# Patient Record
Sex: Male | Born: 1945 | Race: White | Hispanic: No | Marital: Married | Smoking: Never smoker
Health system: Southern US, Community
[De-identification: ages and names within clinical notes are randomized; demographics above are authoritative.]

## PROBLEM LIST (undated history)

## (undated) DIAGNOSIS — I10 Essential (primary) hypertension: Secondary | ICD-10-CM

## (undated) DIAGNOSIS — R7303 Prediabetes: Secondary | ICD-10-CM

## (undated) DIAGNOSIS — N189 Chronic kidney disease, unspecified: Secondary | ICD-10-CM

## (undated) DIAGNOSIS — I639 Cerebral infarction, unspecified: Secondary | ICD-10-CM

## (undated) HISTORY — DX: Prediabetes: R73.03

## (undated) HISTORY — PX: TONSILLECTOMY: SUR1361

## (undated) HISTORY — DX: Chronic kidney disease, unspecified: N18.9

## (undated) HISTORY — PX: CHOLECYSTECTOMY: SHX55

---

## 2003-03-07 ENCOUNTER — Ambulatory Visit (HOSPITAL_COMMUNITY): Admission: RE | Admit: 2003-03-07 | Discharge: 2003-03-07 | Payer: Self-pay | Admitting: Internal Medicine

## 2014-12-15 DIAGNOSIS — R7309 Other abnormal glucose: Secondary | ICD-10-CM | POA: Diagnosis not present

## 2015-01-08 DIAGNOSIS — J019 Acute sinusitis, unspecified: Secondary | ICD-10-CM | POA: Diagnosis not present

## 2015-01-08 DIAGNOSIS — B9689 Other specified bacterial agents as the cause of diseases classified elsewhere: Secondary | ICD-10-CM | POA: Diagnosis not present

## 2015-03-22 DIAGNOSIS — E119 Type 2 diabetes mellitus without complications: Secondary | ICD-10-CM | POA: Diagnosis not present

## 2015-05-14 DIAGNOSIS — D225 Melanocytic nevi of trunk: Secondary | ICD-10-CM | POA: Diagnosis not present

## 2015-05-14 DIAGNOSIS — L820 Inflamed seborrheic keratosis: Secondary | ICD-10-CM | POA: Diagnosis not present

## 2015-05-14 DIAGNOSIS — L821 Other seborrheic keratosis: Secondary | ICD-10-CM | POA: Diagnosis not present

## 2015-06-19 DIAGNOSIS — R42 Dizziness and giddiness: Secondary | ICD-10-CM | POA: Diagnosis not present

## 2015-06-19 DIAGNOSIS — H8111 Benign paroxysmal vertigo, right ear: Secondary | ICD-10-CM | POA: Diagnosis not present

## 2015-07-12 DIAGNOSIS — R1032 Left lower quadrant pain: Secondary | ICD-10-CM | POA: Diagnosis not present

## 2015-07-12 DIAGNOSIS — K5909 Other constipation: Secondary | ICD-10-CM | POA: Diagnosis not present

## 2015-07-12 DIAGNOSIS — R1031 Right lower quadrant pain: Secondary | ICD-10-CM | POA: Diagnosis not present

## 2015-07-12 DIAGNOSIS — K5900 Constipation, unspecified: Secondary | ICD-10-CM | POA: Diagnosis not present

## 2015-07-12 DIAGNOSIS — F419 Anxiety disorder, unspecified: Secondary | ICD-10-CM | POA: Diagnosis not present

## 2015-07-13 DIAGNOSIS — H8112 Benign paroxysmal vertigo, left ear: Secondary | ICD-10-CM | POA: Diagnosis not present

## 2015-07-18 DIAGNOSIS — H8112 Benign paroxysmal vertigo, left ear: Secondary | ICD-10-CM | POA: Diagnosis not present

## 2016-05-02 ENCOUNTER — Encounter (INDEPENDENT_AMBULATORY_CARE_PROVIDER_SITE_OTHER): Payer: Self-pay | Admitting: *Deleted

## 2016-06-19 ENCOUNTER — Other Ambulatory Visit: Payer: Self-pay | Admitting: Student

## 2016-06-19 ENCOUNTER — Ambulatory Visit: Payer: Self-pay

## 2016-06-19 DIAGNOSIS — M79662 Pain in left lower leg: Secondary | ICD-10-CM

## 2016-06-20 ENCOUNTER — Ambulatory Visit: Payer: Self-pay

## 2016-07-02 ENCOUNTER — Encounter (INDEPENDENT_AMBULATORY_CARE_PROVIDER_SITE_OTHER): Payer: Self-pay | Admitting: *Deleted

## 2016-07-02 ENCOUNTER — Other Ambulatory Visit (INDEPENDENT_AMBULATORY_CARE_PROVIDER_SITE_OTHER): Payer: Self-pay | Admitting: *Deleted

## 2016-07-02 DIAGNOSIS — Z1211 Encounter for screening for malignant neoplasm of colon: Secondary | ICD-10-CM | POA: Insufficient documentation

## 2016-10-09 ENCOUNTER — Encounter (INDEPENDENT_AMBULATORY_CARE_PROVIDER_SITE_OTHER): Payer: Self-pay | Admitting: *Deleted

## 2016-10-09 ENCOUNTER — Telehealth (INDEPENDENT_AMBULATORY_CARE_PROVIDER_SITE_OTHER): Payer: Self-pay | Admitting: *Deleted

## 2016-10-09 NOTE — Telephone Encounter (Signed)
Patient needs trilyte 

## 2016-10-14 MED ORDER — PEG 3350-KCL-NA BICARB-NACL 420 G PO SOLR: 4000.0000 mL | Freq: Once | ORAL | 0 refills | Status: AC

## 2016-10-21 ENCOUNTER — Telehealth (INDEPENDENT_AMBULATORY_CARE_PROVIDER_SITE_OTHER): Payer: Self-pay | Admitting: *Deleted

## 2016-10-21 NOTE — Telephone Encounter (Signed)
agree

## 2016-10-21 NOTE — Telephone Encounter (Signed)
Referring MD/PCP: hedrick   Procedure: tcs  Reason/Indication:  screening  Has patient had this procedure before?  Yes, over 10 yrs ago  If so, when, by whom and where?    Is there a family history of colon cancer?  no  Who?  What age when diagnosed?    Is patient diabetic?   no      Does patient have prosthetic heart valve or mechanical valve?  no  Do you have a pacemaker?  no  Has patient ever had endocarditis? no  Has patient had joint replacement within last 12 months?  no  Does patient tend to be constipated or take laxatives? no  Does patient have a history of alcohol/drug use?  no  Is patient on Coumadin, Plavix and/or Aspirin? no  Medications: flomax 0.4 mg daily, fluticasone nasal spray prn, claritin prn, albuterol  Allergies: pcn  Medication Adjustment:   Procedure date & time: 11/19/16 at 730

## 2016-11-19 ENCOUNTER — Encounter (HOSPITAL_COMMUNITY): Payer: Self-pay

## 2016-11-19 ENCOUNTER — Ambulatory Visit (HOSPITAL_COMMUNITY): Admit: 2016-11-19 | Payer: Medicare Other | Admitting: Internal Medicine

## 2016-11-19 SURGERY — COLONOSCOPY
Anesthesia: Moderate Sedation

## 2016-12-03 ENCOUNTER — Other Ambulatory Visit (INDEPENDENT_AMBULATORY_CARE_PROVIDER_SITE_OTHER): Payer: Self-pay | Admitting: *Deleted

## 2016-12-03 DIAGNOSIS — Z1211 Encounter for screening for malignant neoplasm of colon: Secondary | ICD-10-CM

## 2016-12-16 ENCOUNTER — Ambulatory Visit (INDEPENDENT_AMBULATORY_CARE_PROVIDER_SITE_OTHER): Payer: Medicare Other | Admitting: Internal Medicine

## 2016-12-16 ENCOUNTER — Encounter: Payer: Self-pay | Admitting: Internal Medicine

## 2016-12-16 VITALS — BP 130/82 | HR 88 | Wt 211.0 lb

## 2016-12-16 DIAGNOSIS — J42 Unspecified chronic bronchitis: Secondary | ICD-10-CM | POA: Diagnosis not present

## 2016-12-16 DIAGNOSIS — R05 Cough: Secondary | ICD-10-CM | POA: Diagnosis not present

## 2016-12-16 DIAGNOSIS — R059 Cough, unspecified: Secondary | ICD-10-CM

## 2016-12-16 MED ORDER — UMECLIDINIUM BROMIDE 62.5 MCG/INH IN AEPB
1.0000 | INHALATION_SPRAY | Freq: Every day | RESPIRATORY_TRACT | 5 refills | Status: DC
Start: 2016-12-16 — End: 2017-02-19

## 2016-12-16 MED ORDER — BENZONATATE 200 MG PO CAPS: 200.0000 mg | ORAL_CAPSULE | Freq: Three times a day (TID) | ORAL | 0 refills | Status: DC

## 2016-12-16 NOTE — Progress Notes (Signed)
[facility] Consultation      Assessment and Plan:  Chronic bronchitis. --this is likely contributing to his cough.  --Stop symbicort, stop nebulizer.  --Start Incruz sample once daily. If helps call us for a prescription.  --If not improved at next visit discussed that he may require bronchoscopy.  --Doubt asthma, did not respond to steroids and has minimal dyspnea.   Chronic cough.  --Start tessalon 200 mg three times daily.   Obesity.  --Weight loss may be beneficial.   Date: 12/16/2016  MRN# [id number] NAHUEL ENCE 08/23/1946  Referring Physician:   EPIGMENIO KOLTON is a 71 y.o. old male seen in consultation for chief complaint of:    Chief Complaint  Patient presents with  . Advice Only    cough, prod at times; denies other symptoms    HPI:  Pt is a 71 yo male with a history of asthma. He has seen Dr. Raul Del, for asthma over the past 6 months, he has been tried on prilosec, flonase, Tramadol, Delsym, prednisone. More recently, he was tried on Symbicort and albuterol. Review of outside PFT showed evidence of restrictive lung disease, but otherwise normal pulmonary functions.  His main problem started about a  7 or 8 months ago. Back then he has been having a dry hacking cough, it is productive of a little bit of mucus occasionally but usually dry. His muscles get sore from coughing. He has occasional paroxysms of coughing which make him dizzy. He notes an irritation in this throat and chest which starts in his chest.  He has been taking hydrocodone at night which helps but makes him very sleepy.  He has tried nyquil, robitussin, mucinex helped mildly but not much. He has tried lozenges which help.  He does not feel that symbicort is helping, also it gives him indigestion, he is using it 2 puffs bid, he hsa been on it.  He uses albuterol neb once or twice per day, but does not think that it helps.   They travel a lot, but they have not since he has  been travelling. They have no pets.  He worked as a Company secretary. He has never been diagnosed with lung problems in the past, he has never been a smoker.   He has been tried on [employer].   Reviewed outside records, Dr. Leane Platt recent office notes from 11/28/16;  Pt was treated for asthma, on prednisone, albuterol, symbicort, and potentially may require sleep study.   Diagnostics: SPIROMETRY: FVC was 3.44 liters, 105% of predicted FEV1 was 2.70, 105% of predicted FEV1 ratio was 79 FEF 25-75% liters per second was 97% of predicted  LUNG VOLUMES: TLC was 86% of predicted RV was 57% of predicted  DIFFUSION CAPACITY: DLCO was 108% of predicted DLCO/VA was 145% of predicted  FLOW VOLUME LOOP: Normal   Impression Overall normal study   PMHX:   History reviewed. No pertinent past medical history. Surgical Hx:  Past Surgical History:  Procedure Laterality Date  . CHOLECYSTECTOMY     Family Hx:  Family History  Problem Relation Age of Onset  . Heart disease Mother   . Hyperlipidemia Mother   . Heart disease Father   . Heart disease Maternal Grandmother   . Heart attack Maternal Grandmother    Social Hx:   Social History  Substance Use Topics  . Smoking status: Never Smoker  . Smokeless tobacco: Never Used  . Alcohol use No   Medication:   Reviewed.  Allergies:  Penicillins  Review of Systems: Gen:  Denies  fever, sweats, chills HEENT: Denies blurred vision, double vision. bleeds, sore throat Cvc:  No dizziness, chest pain. Resp:   Denies cough or sputum production, shortness of breath Gi: Denies swallowing difficulty, stomach pain. Gu:  Denies bladder incontinence, burning urine Ext:   No Joint pain, stiffness. Skin: No skin rash,  hives  Endoc:  No polyuria, polydipsia. Psych: No depression, insomnia. Other:  All other systems were reviewed with the patient and were negative other that what is mentioned in the HPI.   Physical Examination:   VS: BP  130/82 (BP Location: Left Arm, Cuff Size: Normal)   Pulse 88   Wt 211 lb (95.7 kg)   SpO2 98%   General Appearance: No distress  Neuro:without focal findings,  speech normal,  HEENT: PERRLA, EOM intact.   Pulmonary: normal breath sounds, No wheezing.  CardiovascularNormal S1,S2.  No m/r/g.   Abdomen: Benign, Soft, non-tender. Renal:  No costovertebral tenderness  GU:  No performed at this time. Endoc: No evident thyromegaly, no signs of acromegaly. Skin:   warm, no rashes, no ecchymosis  Extremities: normal, no cyanosis, clubbing.  Other findings:    LABORATORY PANEL:   CBC No results for input(s): WBC, HGB, HCT, PLT in the last 168 hours. ------------------------------------------------------------------------------------------------------------------  Chemistries  No results for input(s): NA, K, CL, CO2, GLUCOSE, BUN, CREATININE, CALCIUM, MG, AST, ALT, ALKPHOS, BILITOT in the last 168 hours.  Invalid input(s): GFRCGP ------------------------------------------------------------------------------------------------------------------  Cardiac Enzymes No results for input(s): TROPONINI in the last 168 hours. ------------------------------------------------------------  RADIOLOGY:  No results found.     Thank  you for the consultation and for allowing [facility] to assist in the care of your patient. Our recommendations are noted above.  Please contact us if we can be of further service.   Marda Stalker, MD.  Board Certified in Internal Medicine, Pulmonary Medicine, [place], and Sleep Medicine.  [facility] Office Number: [phone]  Patricia Pesa, M.D.  Vilinda Boehringer, M.D.  Merton Border, M.D  12/16/2016

## 2016-12-16 NOTE — Patient Instructions (Addendum)
--  Stop symbicort, stop nebulizer.   --Start Incruz sample once daily. If helps call us for a prescription.   --Start tessalon 200 mg three times daily.   --Take flonase and claritin.

## 2016-12-19 ENCOUNTER — Telehealth: Payer: Self-pay | Admitting: Internal Medicine

## 2016-12-19 NOTE — Telephone Encounter (Signed)
Pt states in order for him to use his coupon card for Incruse, he has to have a prescription. Please call.

## 2016-12-19 NOTE — Telephone Encounter (Signed)
Pt informed his RX has been ready at [facility] since 10/15/17. Pt states he will go pick it up. Nothing further needed.

## 2016-12-29 ENCOUNTER — Telehealth: Payer: Self-pay | Admitting: Internal Medicine

## 2016-12-29 MED ORDER — PREDNISONE 10 MG PO TABS: ORAL_TABLET | ORAL | 0 refills | Status: DC

## 2016-12-29 NOTE — Telephone Encounter (Signed)
Spoke with pt's spouse, who states pt has lingering non prod cough, wheezing, increased fatigued, weakness & no appetite. Pt's wife states they have an upcoming trip in two weeks, and she is afraid they will not be able to make it.  Denies fever, chills or sweats.   DR please advise. Thanks.

## 2016-12-29 NOTE — Telephone Encounter (Signed)
Pt wife called, states pt is no better, states he is coughing a lot, has a lot of congestion, is not eating, and sleeping most of the day. Please call.

## 2016-12-29 NOTE — Telephone Encounter (Signed)
Will try another course of prednisone Prednisone 10 mg tabs x 21.  Take 6 tablets on day 1 Take 5 tablets on day 2 Take 4 tablets on day 3 Take 3 tablets on day 4 Take 2 tablets on day 5 Take 1 tablet on day 6 then stop.

## 2016-12-29 NOTE — Telephone Encounter (Signed)
Pt aware of DR's recommendations & voiced his understanding. Rx sent to preferred pharmacy. Nothing further needed.

## 2017-01-05 ENCOUNTER — Telehealth: Payer: Self-pay | Admitting: Internal Medicine

## 2017-01-05 NOTE — Telephone Encounter (Signed)
Pt called, states he is not better, states he has been on pan inhaler, with no results. Please call.

## 2017-01-06 NOTE — Telephone Encounter (Signed)
Pt scheduled to come in and see DR on Thursday 01/08/17. Nothing further needed.

## 2017-01-06 NOTE — Telephone Encounter (Signed)
Pt is calling back and is very upset  Stating he called yesterday morning and has not even gotten a call back to check on him  He is upset and states he needs a call back ASAP or he will find another provider  He stated he had an appointment about a month ago He was in the inhaler he was using Incruse  He Called about two week ago about this We placed him on prednisone, he has done a few rounds of that with other providers   States it did not help  Side effects from that were: bowel control/sleeplessness/ irritability/ States the cough is still there but it is now worst than before   He states he really needs to be seen soon or given more advise on how to help him States if that can't happen then he will look elsewhere where they can do more than a once a month visit.   States he is been working on it since June and it doesn't seem to be getting any better.  If we don't have the time to even call that is not okay he will find another provider   Please advise.   He is also going out of town next Tuesday but needs to feel better before than  He is not feeling like we are truly taking care of him.

## 2017-01-07 ENCOUNTER — Telehealth: Payer: Self-pay | Admitting: *Deleted

## 2017-01-07 DIAGNOSIS — R05 Cough: Secondary | ICD-10-CM

## 2017-01-07 DIAGNOSIS — R059 Cough, unspecified: Secondary | ICD-10-CM

## 2017-01-07 NOTE — Telephone Encounter (Signed)
Pt informed to get a CXR prior to appt on 01/08/17.  CXR ordered. Nothing further needed.

## 2017-01-07 NOTE — Progress Notes (Signed)
[facility] Consultation      Assessment and Plan:  Chronic bronchitis. --this is likely contributing to his cough.  --Stop symbicort, stop nebulizer.  --Start Incruz sample once daily. If helps call us for a prescription.  --If not improved at next visit discussed that he may require bronchoscopy.  --Doubt asthma, did not respond to steroids and has minimal dyspnea.   Chronic cough.  --Continue tessalon 200 mg three times daily.  --Add mucinex DM three times daily scheduled.   Reflux/GERD.  --Continue nexium, reflux appears controlled on this.   Obesity.  --Weight loss may be beneficial.   Date: 01/07/2017  MRN# [id number] BILLYJOE GO [birth year]  Referring Physician:   ALEKSEI GOODLIN is a 71 y.o. old male seen in consultation for chief complaint of:    Chief Complaint  Patient presents with  . Acute Visit    cough no better even after Prednisone: prod cough w/yellow mucus: chest tightness    HPI:  Pt is a 71 yo male with a history of asthma. He has seen Dr. Raul Del, for asthma over the past 6 months, he has been tried on multiple different medications which have not been helpful including prilosec, prednisone, flonase, Tramadol, Delsym, prednisone, tessalon, Symbicort and albuterol. Nyquil, robitussin, mucinex were mildly helpful. Cough lozenges appeared to help. Review of outside PFT showed evidence of restrictive lung disease, but otherwise normal pulmonary functions. He has been taking hydrocodone at night which helps but makes him very sleepy.    Since last visit he was started on incruz which he took as instructed but did not help. He was then started on prednisone which did not help. He continues on tessalon, which have not helped. He notes that cough drops help a little bit.  He continues on nexium daily. He denies sinus drainage currently, he gets it occasionally. He does take claritin twice daily.    Reviewed outside records, Dr. Leane Platt  recent office notes from 11/28/16;  Pt was treated for asthma, on prednisone, albuterol, symbicort, and potentially may require sleep study.   **Images reviewed, chest x-ray. 01/08/17: Unremarkable. Lungs.  Diagnostics: SPIROMETRY: FVC was 3.44 liters, 105% of predicted FEV1 was 2.70, 105% of predicted FEV1 ratio was 79 FEF 25-75% liters per second was 97% of predicted  LUNG VOLUMES: TLC was 86% of predicted RV was 57% of predicted  DIFFUSION CAPACITY: DLCO was 108% of predicted DLCO/VA was 145% of predicted  FLOW VOLUME LOOP: Normal   Impression Overall normal study   PMHX:   No past medical history on file. Surgical Hx:  Past Surgical History:  Procedure Laterality Date  . CHOLECYSTECTOMY     Family Hx:  Family History  Problem Relation Age of Onset  . Heart disease Mother   . Hyperlipidemia Mother   . Heart disease Father   . Heart disease Maternal Grandmother   . Heart attack Maternal Grandmother    Social Hx:   Social History  Substance Use Topics  . Smoking status: Never Smoker  . Smokeless tobacco: Never Used  . Alcohol use No   Medication:   Reviewed.     Allergies:  Penicillins  Review of Systems: Gen:  Denies  fever, sweats, chills HEENT: Denies blurred vision, double vision. bleeds, sore throat Cvc:  No dizziness, chest pain. Resp:   Denies cough or sputum production, shortness of breath Gi: Denies swallowing difficulty, stomach pain. Gu:  Denies bladder incontinence, burning urine Ext:   No Joint pain, stiffness.  Skin: No skin rash,  hives  Endoc:  No polyuria, polydipsia. Psych: No depression, insomnia. Other:  All other systems were reviewed with the patient and were negative other that what is mentioned in the HPI.   Physical Examination:   VS: BP 134/88 (Cuff Size: Normal)   Pulse 96   Wt 210 lb (95.3 kg)   SpO2 96%   General Appearance: No distress  Neuro:without focal findings,  speech normal,  HEENT: PERRLA, EOM intact.     Pulmonary: normal breath sounds, No wheezing.  CardiovascularNormal S1,S2.  No m/r/g.   Abdomen: Benign, Soft, non-tender. Renal:  No costovertebral tenderness  GU:  No performed at this time. Endoc: No evident thyromegaly, no signs of acromegaly. Skin:   warm, no rashes, no ecchymosis  Extremities: normal, no cyanosis, clubbing.  Other findings:    LABORATORY PANEL:   CBC No results for input(s): WBC, HGB, HCT, PLT in the last 168 hours. ------------------------------------------------------------------------------------------------------------------  Chemistries  No results for input(s): NA, K, CL, CO2, GLUCOSE, BUN, CREATININE, CALCIUM, MG, AST, ALT, ALKPHOS, BILITOT in the last 168 hours.  Invalid input(s): GFRCGP ------------------------------------------------------------------------------------------------------------------  Cardiac Enzymes No results for input(s): TROPONINI in the last 168 hours. ------------------------------------------------------------  RADIOLOGY:  No results found.     Thank  you for the consultation and for allowing [facility] to assist in the care of your patient. Our recommendations are noted above.  Please contact us if we can be of further service.   Marda Stalker, MD.  Board Certified in Internal Medicine, Pulmonary Medicine, [place], and Sleep Medicine.  [facility] Office Number: [phone]  Patricia Pesa, M.D.  Vilinda Boehringer, M.D.  Merton Border, M.D  01/07/2017

## 2017-01-07 NOTE — Telephone Encounter (Signed)
-----   Message from [name], MD sent at 01/07/2017  4:15 AM EST ----- Regarding: CXR before OV.  Pt needs 2 v CXR before his OV with me ( if he had one recently at [facility], will still need another one as I can not view [facility] images).

## 2017-01-08 ENCOUNTER — Ambulatory Visit
Admission: RE | Admit: 2017-01-08 | Discharge: 2017-01-08 | Disposition: A | Discharge status: Home / Self Care | Payer: Medicare Other | Source: Ambulatory Visit | Attending: Internal Medicine | Admitting: Internal Medicine

## 2017-01-08 ENCOUNTER — Ambulatory Visit (INDEPENDENT_AMBULATORY_CARE_PROVIDER_SITE_OTHER): Payer: Medicare Other | Admitting: Internal Medicine

## 2017-01-08 ENCOUNTER — Encounter: Payer: Self-pay | Admitting: Internal Medicine

## 2017-01-08 VITALS — BP 134/88 | HR 96 | Wt 210.0 lb

## 2017-01-08 DIAGNOSIS — R918 Other nonspecific abnormal finding of lung field: Secondary | ICD-10-CM | POA: Diagnosis not present

## 2017-01-08 DIAGNOSIS — J42 Unspecified chronic bronchitis: Secondary | ICD-10-CM | POA: Diagnosis not present

## 2017-01-08 DIAGNOSIS — R059 Cough, unspecified: Secondary | ICD-10-CM

## 2017-01-08 DIAGNOSIS — R05 Cough: Secondary | ICD-10-CM

## 2017-01-08 MED ORDER — FLUTICASONE PROPIONATE 50 MCG/ACT NA SUSP: 2.0000 | Freq: Every day | NASAL | 2 refills | Status: AC

## 2017-01-08 NOTE — Patient Instructions (Addendum)
--  Will need to perform diagnostic bronchoscopy-- follow up about 1 month after bronchoscopy. DO NOT TAKE ANY ANTIBIOTICS OR ANY PREDNISONE BEFORE PROCEDURE.   --Take mucinex DM three times daily. Continue cough drops.   --Will check CBC with differential before bronchoscopy.   --Will check RAST testing before bronchoscopy-- stop claritin for one week before RAST test.   --Use flonase 2 sprays in each nostril at bedtime.

## 2017-01-20 ENCOUNTER — Telehealth: Payer: Self-pay | Admitting: Internal Medicine

## 2017-01-20 NOTE — Telephone Encounter (Signed)
Pt states he will be back in town on 3/22. States he understands he is to have labwork. Please call.

## 2017-01-20 NOTE — Telephone Encounter (Signed)
Informed pt where to go and get his lab work. Nothing further needed.

## 2017-01-22 ENCOUNTER — Other Ambulatory Visit
Admission: RE | Admit: 2017-01-22 | Discharge: 2017-01-22 | Disposition: A | Discharge status: Home / Self Care | Payer: Medicare Other | Source: Ambulatory Visit | Attending: Internal Medicine | Admitting: Internal Medicine

## 2017-01-22 DIAGNOSIS — R05 Cough: Secondary | ICD-10-CM | POA: Diagnosis present

## 2017-01-22 DIAGNOSIS — J42 Unspecified chronic bronchitis: Secondary | ICD-10-CM | POA: Insufficient documentation

## 2017-01-22 LAB — CBC WITH DIFFERENTIAL/PLATELET
BASOS ABS: 0 10*3/uL (ref 0–0.1)
Basophils Relative: 0 %
EOS ABS: 0.2 10*3/uL (ref 0–0.7)
EOS PCT: 3 %
HCT: 46.9 % (ref 40.0–52.0)
Hemoglobin: 15.8 g/dL (ref 13.0–18.0)
LYMPHS ABS: 1.6 10*3/uL (ref 1.0–3.6)
Lymphocytes Relative: 23 %
MCH: 30.5 pg (ref 26.0–34.0)
MCHC: 33.6 g/dL (ref 32.0–36.0)
MCV: 90.7 fL (ref 80.0–100.0)
MONO ABS: 0.6 10*3/uL (ref 0.2–1.0)
MONOS PCT: 8 %
NEUTROS PCT: 66 %
Neutro Abs: 4.6 10*3/uL (ref 1.4–6.5)
PLATELETS: 234 10*3/uL (ref 150–440)
RBC: 5.17 MIL/uL (ref 4.40–5.90)
RDW: 13.7 % (ref 11.5–14.5)
WBC: 7 10*3/uL (ref 3.8–10.6)

## 2017-01-27 ENCOUNTER — Ambulatory Visit: Payer: Medicare Other | Admitting: Internal Medicine

## 2017-01-27 LAB — LATEX, IGE

## 2017-01-27 NOTE — Progress Notes (Deleted)
[facility] Consultation      Assessment and Plan:  Chronic bronchitis. --this is likely contributing to his cough.  --Stop symbicort, stop nebulizer.  --If not improved at next visit discussed that he may require bronchoscopy.  --Doubt asthma, did not respond to steroids and has minimal dyspnea.   Chronic cough.  --Continue tessalon 200 mg three times daily.  --Add mucinex DM three times daily scheduled.   Reflux/GERD.  --Continue nexium, reflux appears controlled on this.   Obesity.  --Weight loss may be beneficial.   Date: 01/27/2017  MRN# [id number] MAKS CAVALLERO August 04, 1946  Referring Physician:   ANTHONIE LOTITO is a 71 y.o. old male seen in consultation for chief complaint of:    No chief complaint on file.   HPI:  Pt is a 71 yo male with a history of chronic bronchitis, chronic and recurrent cough. He has been tried on multiple different medications which have not been helpful including prilosec, prednisone, flonase, Tramadol, Delsym, prednisone, tessalon, Symbicort and albuterol, Incruz.  Nyquil, robitussin, mucinex were mildly helpful. Cough lozenges appeared to help. At last visit, it was decided that we would do a diagnostic bronchoscopy, he was asked to check a CBC with differential, and rest test before bronchoscopy. He was asked to stop Claritin one week before rest testing, and use Flonase 2 sprays in each nostril.  Review of outside PFT showed evidence of restrictive lung disease, but otherwise normal pulmonary functions. He has been taking hydrocodone at night which helps but makes him very sleepy.    He continues on nexium daily. He denies sinus drainage currently, he gets it occasionally. He does take claritin twice daily.    Reviewed outside records, Dr. Leane Platt recent office notes from 11/28/16;  Pt was treated for asthma, on prednisone, albuterol, symbicort, and potentially may require sleep study.   **Images reviewed, chest  x-ray. 01/08/17: Unremarkable. Lungs.  Diagnostics: SPIROMETRY: FVC was 3.44 liters, 105% of predicted FEV1 was 2.70, 105% of predicted FEV1 ratio was 79 FEF 25-75% liters per second was 97% of predicted  LUNG VOLUMES: TLC was 86% of predicted RV was 57% of predicted  DIFFUSION CAPACITY: DLCO was 108% of predicted DLCO/VA was 145% of predicted  FLOW VOLUME LOOP: Normal   Impression Overall normal study   PMHX:   No past medical history on file. Surgical Hx:  Past Surgical History:  Procedure Laterality Date  . CHOLECYSTECTOMY     Family Hx:  Family History  Problem Relation Age of Onset  . Heart disease Mother   . Hyperlipidemia Mother   . Heart disease Father   . Heart disease Maternal Grandmother   . Heart attack Maternal Grandmother    Social Hx:   Social History  Substance Use Topics  . Smoking status: Never Smoker  . Smokeless tobacco: Never Used  . Alcohol use No   Medication:   Reviewed.     Allergies:  Penicillins  Review of Systems: Gen:  Denies  fever, sweats, chills HEENT: Denies blurred vision, double vision. bleeds, sore throat Cvc:  No dizziness, chest pain. Resp:   Denies cough or sputum production, shortness of breath Gi: Denies swallowing difficulty, stomach pain. Gu:  Denies bladder incontinence, burning urine Ext:   No Joint pain, stiffness. Skin: No skin rash,  hives  Endoc:  No polyuria, polydipsia. Psych: No depression, insomnia. Other:  All other systems were reviewed with the patient and were negative other that what is mentioned in the HPI.  Physical Examination:   VS: There were no vitals taken for this visit.  General Appearance: No distress  Neuro:without focal findings,  speech normal,  HEENT: PERRLA, EOM intact.   Pulmonary: normal breath sounds, No wheezing.  CardiovascularNormal S1,S2.  No m/r/g.   Abdomen: Benign, Soft, non-tender. Renal:  No costovertebral tenderness  GU:  No performed at this time. Endoc:  No evident thyromegaly, no signs of acromegaly. Skin:   warm, no rashes, no ecchymosis  Extremities: normal, no cyanosis, clubbing.  Other findings:    LABORATORY PANEL:   CBC  Recent Labs Lab 01/22/17 1212  WBC 7.0  HGB 15.8  HCT 46.9  PLT 234   ------------------------------------------------------------------------------------------------------------------  Chemistries  No results for input(s): NA, K, CL, CO2, GLUCOSE, BUN, CREATININE, CALCIUM, MG, AST, ALT, ALKPHOS, BILITOT in the last 168 hours.  Invalid input(s): GFRCGP ------------------------------------------------------------------------------------------------------------------  Cardiac Enzymes No results for input(s): TROPONINI in the last 168 hours. ------------------------------------------------------------  RADIOLOGY:  No results found.     Thank  you for the consultation and for allowing [facility] to assist in the care of your patient. Our recommendations are noted above.  Please contact us if we can be of further service.   Marda Stalker, MD.  Board Certified in Internal Medicine, Pulmonary Medicine, [place], and Sleep Medicine.  [facility] Office Number: [phone]  Patricia Pesa, M.D.  Vilinda Boehringer, M.D.  Merton Border, M.D  01/27/2017

## 2017-02-04 ENCOUNTER — Telehealth (INDEPENDENT_AMBULATORY_CARE_PROVIDER_SITE_OTHER): Payer: Self-pay | Admitting: *Deleted

## 2017-02-04 ENCOUNTER — Encounter (INDEPENDENT_AMBULATORY_CARE_PROVIDER_SITE_OTHER): Payer: Self-pay | Admitting: *Deleted

## 2017-02-04 NOTE — Telephone Encounter (Signed)
Referring MD/PCP: hedrick   Procedure: tcs  Reason/Indication:  screening  Has patient had this procedure before?  Yes, over 10 yrs ago             If so, when, by whom and where?    Is there a family history of colon cancer?  no             Who?  What age when diagnosed?    Is patient diabetic?   no                                                  Does patient have prosthetic heart valve or mechanical valve?  no  Do you have a pacemaker?  no  Has patient ever had endocarditis? no  Has patient had joint replacement within last 12 months?  no  Does patient tend to be constipated or take laxatives? no  Does patient have a history of alcohol/drug use?  no  Is patient on Coumadin, Plavix and/or Aspirin? no  Medications: flomax 0.4 mg daily, fluticasone nasal spray prn, claritin prn, albuterol  Allergies: pcn  Medication Adjustment:   Procedure date & time: 03/04/17 at 830

## 2017-02-04 NOTE — Telephone Encounter (Signed)
agree

## 2017-02-05 ENCOUNTER — Telehealth: Payer: Self-pay | Admitting: Internal Medicine

## 2017-02-05 ENCOUNTER — Encounter
Admission: RE | Disposition: A | Discharge status: Home / Self Care | Payer: Self-pay | Source: Ambulatory Visit | Attending: Internal Medicine

## 2017-02-05 ENCOUNTER — Ambulatory Visit
Admission: RE | Admit: 2017-02-05 | Discharge: 2017-02-05 | Disposition: A | Discharge status: Home / Self Care | Payer: Medicare Other | Source: Ambulatory Visit | Attending: Internal Medicine | Admitting: Internal Medicine

## 2017-02-05 DIAGNOSIS — J42 Unspecified chronic bronchitis: Secondary | ICD-10-CM | POA: Insufficient documentation

## 2017-02-05 DIAGNOSIS — K219 Gastro-esophageal reflux disease without esophagitis: Secondary | ICD-10-CM | POA: Diagnosis not present

## 2017-02-05 DIAGNOSIS — J984 Other disorders of lung: Secondary | ICD-10-CM | POA: Diagnosis not present

## 2017-02-05 DIAGNOSIS — R05 Cough: Secondary | ICD-10-CM | POA: Insufficient documentation

## 2017-02-05 DIAGNOSIS — E669 Obesity, unspecified: Secondary | ICD-10-CM | POA: Insufficient documentation

## 2017-02-05 HISTORY — PX: FLEXIBLE BRONCHOSCOPY: SHX5094

## 2017-02-05 SURGERY — BRONCHOSCOPY, FLEXIBLE
Anesthesia: Moderate Sedation

## 2017-02-05 SURGERY — BRONCHOSCOPY, FLEXIBLE
Anesthesia: Moderate Sedation | Laterality: Bilateral

## 2017-02-05 MED ORDER — FENTANYL CITRATE (PF) 100 MCG/2ML IJ SOLN
INTRAMUSCULAR | Status: AC | PRN
  Administered 2017-02-05: 50 ug via INTRAVENOUS

## 2017-02-05 MED ORDER — FENTANYL CITRATE (PF) 100 MCG/2ML IJ SOLN
INTRAMUSCULAR | Status: AC
  Filled 2017-02-05: qty 4

## 2017-02-05 MED ORDER — LIDOCAINE HCL 2 % EX GEL: 1.0000 "application " | Freq: Once | CUTANEOUS | Status: DC

## 2017-02-05 MED ORDER — MIDAZOLAM HCL 5 MG/5ML IJ SOLN
INTRAMUSCULAR | Status: AC
  Filled 2017-02-05: qty 10

## 2017-02-05 MED ORDER — BUTAMBEN-TETRACAINE-BENZOCAINE 2-2-14 % EX AERO
1.0000 | INHALATION_SPRAY | Freq: Once | CUTANEOUS | Status: DC
  Filled 2017-02-05: qty 20

## 2017-02-05 MED ORDER — MIDAZOLAM HCL 2 MG/2ML IJ SOLN
INTRAMUSCULAR | Status: AC | PRN
  Administered 2017-02-05 (×2): 2 mg via INTRAVENOUS

## 2017-02-05 MED ORDER — PHENYLEPHRINE HCL 0.25 % NA SOLN
1.0000 | Freq: Four times a day (QID) | NASAL | Status: DC | PRN
  Filled 2017-02-05: qty 15

## 2017-02-05 MED ORDER — BUTAMBEN-TETRACAINE-BENZOCAINE 2-2-14 % EX AERO
INHALATION_SPRAY | CUTANEOUS | Status: AC
  Filled 2017-02-05: qty 20

## 2017-02-05 NOTE — Sedation Documentation (Signed)
Patient tolerated procedure well. Received total of 16mcg fentanyl and 4mg  versed. Vital signs stable. Will transfer to recovery area shortly.

## 2017-02-05 NOTE — H&P (View-Only) (Signed)
[facility] Consultation      Assessment and Plan:  Chronic bronchitis. --this is likely contributing to his cough.  --Stop symbicort, stop nebulizer.  --Start Incruz sample once daily. If helps call us for a prescription.  --If not improved at next visit discussed that he may require bronchoscopy.  --Doubt asthma, did not respond to steroids and has minimal dyspnea.   Chronic cough.  --Continue tessalon 200 mg three times daily.  --Add mucinex DM three times daily scheduled.   Reflux/GERD.  --Continue nexium, reflux appears controlled on this.   Obesity.  --Weight loss may be beneficial.   Date: 01/07/2017  MRN# [id number] Tyrone Fox July 15, 1946  Referring Physician:   ELAINE MIDDLETON is a 71 y.o. old male seen in consultation for chief complaint of:    Chief Complaint  Patient presents with  . Acute Visit    cough no better even after Prednisone: prod cough w/yellow mucus: chest tightness    HPI:  Pt is a 71 yo male with a history of asthma. He has seen Dr. Raul Del, for asthma over the past 6 months, he has been tried on multiple different medications which have not been helpful including prilosec, prednisone, flonase, Tramadol, Delsym, prednisone, tessalon, Symbicort and albuterol. Nyquil, robitussin, mucinex were mildly helpful. Cough lozenges appeared to help. Review of outside PFT showed evidence of restrictive lung disease, but otherwise normal pulmonary functions. He has been taking hydrocodone at night which helps but makes him very sleepy.    Since last visit he was started on incruz which he took as instructed but did not help. He was then started on prednisone which did not help. He continues on tessalon, which have not helped. He notes that cough drops help a little bit.  He continues on nexium daily. He denies sinus drainage currently, he gets it occasionally. He does take claritin twice daily.    Reviewed outside records, Dr. Leane Platt  recent office notes from 11/28/16;  Pt was treated for asthma, on prednisone, albuterol, symbicort, and potentially may require sleep study.   **Images reviewed, chest x-ray. 01/08/17: Unremarkable. Lungs.  Diagnostics: SPIROMETRY: FVC was 3.44 liters, 105% of predicted FEV1 was 2.70, 105% of predicted FEV1 ratio was 79 FEF 25-75% liters per second was 97% of predicted  LUNG VOLUMES: TLC was 86% of predicted RV was 57% of predicted  DIFFUSION CAPACITY: DLCO was 108% of predicted DLCO/VA was 145% of predicted  FLOW VOLUME LOOP: Normal   Impression Overall normal study   PMHX:   No past medical history on file. Surgical Hx:  Past Surgical History:  Procedure Laterality Date  . CHOLECYSTECTOMY     Family Hx:  Family History  Problem Relation Age of Onset  . Heart disease Mother   . Hyperlipidemia Mother   . Heart disease Father   . Heart disease Maternal Grandmother   . Heart attack Maternal Grandmother    Social Hx:   Social History  Substance Use Topics  . Smoking status: Never Smoker  . Smokeless tobacco: Never Used  . Alcohol use No   Medication:   Reviewed.     Allergies:  Penicillins  Review of Systems: Gen:  Denies  fever, sweats, chills HEENT: Denies blurred vision, double vision. bleeds, sore throat Cvc:  No dizziness, chest pain. Resp:   Denies cough or sputum production, shortness of breath Gi: Denies swallowing difficulty, stomach pain. Gu:  Denies bladder incontinence, burning urine Ext:   No Joint pain, stiffness.  Skin: No skin rash,  hives  Endoc:  No polyuria, polydipsia. Psych: No depression, insomnia. Other:  All other systems were reviewed with the patient and were negative other that what is mentioned in the HPI.   Physical Examination:   VS: BP 134/88 (Cuff Size: Normal)   Pulse 96   Wt 210 lb (95.3 kg)   SpO2 96%   General Appearance: No distress  Neuro:without focal findings,  speech normal,  HEENT: PERRLA, EOM intact.     Pulmonary: normal breath sounds, No wheezing.  CardiovascularNormal S1,S2.  No m/r/g.   Abdomen: Benign, Soft, non-tender. Renal:  No costovertebral tenderness  GU:  No performed at this time. Endoc: No evident thyromegaly, no signs of acromegaly. Skin:   warm, no rashes, no ecchymosis  Extremities: normal, no cyanosis, clubbing.  Other findings:    LABORATORY PANEL:   CBC No results for input(s): WBC, HGB, HCT, PLT in the last 168 hours. ------------------------------------------------------------------------------------------------------------------  Chemistries  No results for input(s): NA, K, CL, CO2, GLUCOSE, BUN, CREATININE, CALCIUM, MG, AST, ALT, ALKPHOS, BILITOT in the last 168 hours.  Invalid input(s): GFRCGP ------------------------------------------------------------------------------------------------------------------  Cardiac Enzymes No results for input(s): TROPONINI in the last 168 hours. ------------------------------------------------------------  RADIOLOGY:  No results found.     Thank  you for the consultation and for allowing [facility] to assist in the care of your patient. Our recommendations are noted above.  Please contact us if we can be of further service.   Marda Stalker, MD.  Board Certified in Internal Medicine, Pulmonary Medicine, [place], and Sleep Medicine.  [facility] Office Number: [phone]  Patricia Pesa, M.D.  Vilinda Boehringer, M.D.  Merton Border, M.D  01/07/2017

## 2017-02-05 NOTE — Op Note (Signed)
  [facility]            Bronchoscopy Note   FINDINGS/SUMMARY:   --Findings of severe chronic bronchitis throughout both airways.  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  --No significant anatomical abnormalities to explain cough. --BAL taken at lingula and RML.   Indication: Chronic bronchitis, chronic cough The patient (or their representative) was informed of the risks (including but not limited to bleeding, infection, respiratory failure, lung injury, tooth/oral injury) and benefits of the procedure and gave consent, see chart.   Pre-op diagnosis: Chronic bronchitis  Post-op diagnosis: Same, bronchopulmonary dysplasia.  Estimated blood loss: none.   Medications for procedure:  Versed 4 mg, fentanyl 50 mcg.  Procedure description: After obtaining informed consent a timeout was called to confirm patient and procedure. Lidocaine jelly was applied to the left nares, the scope was then passed via the nares to the posterior pharynx, normal movement of the vocal cords was noted, there was significant collapse of the airway in this area possibly consistent with OSA.  AN anatomical tour was undertaken, there was mild bronchopulmonary dysplasia with mild collapse of the posterior trachea and the proximal airways.  There was findings of significant airway erythema throughout both airways consistent with severe chronic bronchitis. There was mild mucosal secretions. The scope was wedged in the lingula, BAL was taken, sent for cytology. The bronchoscope was wedged into the RML and another BAL was taken, sent for culture.  The scope was then removed and the patient was taken to recovery.   Marda Stalker, M.D.  02/05/2017      Condition post procedure: Stable   Complications: none.     Marda Stalker, MD.  Board Certified in Internal Medicine, Pulmonary Medicine, [place], and  Sleep Medicine.  [facility] Office Number: [phone]  Patricia Pesa, M.D.  Cheral Marker, M.D  02/05/2017

## 2017-02-05 NOTE — Interval H&P Note (Signed)
History and Physical Interval Note:  02/05/2017 2:22 PM  Tyrone Fox  has presented today for surgery, with the diagnosis of Regular bronch    Chronic Cough    OK [name] [facility] needed  The various methods of treatment have been discussed with the patient and family. After consideration of risks, benefits and other options for treatment, the patient has consented to  Procedure(s): FLEXIBLE BRONCHOSCOPY (N/A) as a surgical intervention .  The patient's history has been reviewed, patient examined, no change in status, stable for surgery.  I have reviewed the patient's chart and labs.  Questions were answered to the patient's satisfaction.     [name]

## 2017-02-05 NOTE — Telephone Encounter (Signed)
Needs 1-2 wk fu s/p bronch [facility].  Next available in may.

## 2017-02-05 NOTE — Progress Notes (Signed)
Patient remains clinically stable post bronchoscopy, occasional cough, no bleeding visible. Friend remains present at bedside. Denies complaints, discharge instructions given with questions answered. Taking po liquids at this time, tolerating without difficultyl

## 2017-02-05 NOTE — Telephone Encounter (Signed)
Please schedule 02/19/17 @ 12pm with Tyrone Fox. Pt to arrive at 11:45am

## 2017-02-06 ENCOUNTER — Encounter: Payer: Self-pay | Admitting: Internal Medicine

## 2017-02-06 LAB — ACID FAST SMEAR (AFB, MYCOBACTERIA): Acid Fast Smear: NEGATIVE

## 2017-02-06 LAB — ACID FAST SMEAR (AFB)

## 2017-02-06 NOTE — Telephone Encounter (Signed)
lmov to confirm appt is ok.

## 2017-02-06 NOTE — Telephone Encounter (Signed)
Pt called back to confirm apt

## 2017-02-09 LAB — CYTOLOGY - NON PAP

## 2017-02-19 ENCOUNTER — Encounter: Payer: Self-pay | Admitting: Internal Medicine

## 2017-02-19 ENCOUNTER — Ambulatory Visit (INDEPENDENT_AMBULATORY_CARE_PROVIDER_SITE_OTHER): Payer: Medicare Other | Admitting: Internal Medicine

## 2017-02-19 VITALS — BP 132/76 | HR 83 | Ht 67.0 in | Wt 213.6 lb

## 2017-02-19 DIAGNOSIS — J42 Unspecified chronic bronchitis: Secondary | ICD-10-CM | POA: Diagnosis not present

## 2017-02-19 NOTE — Progress Notes (Signed)
[facility] Consultation      Assessment and Plan:  Chronic cough with Chronic bronchitis. --s/p bronchoscopy, negative --continue tessalon, claritin if needed. Can stop nasal steroid.   --Doubt asthma, did not respond to steroids and has minimal dyspnea.   Chronic cough.  --Continue tessalon 200 mg three times daily.  --continue mucinex.  --May use claritin if needed for his allergies.   Reflux/GERD.  --Continue nexium, reflux appears controlled on this.   Obesity.  --Weight loss may be beneficial.   Bronchopulmonary dysplasia.  --Seen on bronchoscopy, this may contribute to difficulty in airway clearance, but likely of secondary importance. The patient would not be a candidate for airway stenting.   Date: 02/19/2017  MRN# [id number] Tyrone Fox November 24, 1945  Referring Physician:   MAHDI FRYE is a 71 y.o. old male seen in consultation for chief complaint of:    Chief Complaint  Patient presents with  . Follow-up    review bronch results. pt states coughing has slightly improved since last OV. pt reports of prod cough with yellow mucus.    HPI:   Since his last visit he underwent a bronchoscopy on 02/05/17 which showed only changes of severe chronic bronchitis,  Mild bronchopulmonary dysplasia, otherwise unremarkable. He feels that he was getting better before the bronchoscopy and he continues to do so. He has also started going to the gym and doing more exercise and going to the sauna.   He was started on incruz which he took as instructed but did not help. He was then started on prednisone which did not help. He continues on tessalon, which have not helped. He notes that cough drops help a little bit.  He continues on nexium daily. He denies sinus drainage currently, he gets it occasionally. He does take mucinex twice daily, claritin once daily, he stopped flonase.    Reviewed outside records, Dr. Leane Platt recent office notes from 11/28/16;  Pt  was treated for asthma, on prednisone, albuterol, symbicort, and potentially may require sleep study. He notes that he snores when he is very tired.   **Images:chest x-ray. 01/08/17: Unremarkable. Lungs.  Diagnostics: SPIROMETRY: FVC was 3.44 liters, 105% of predicted FEV1 was 2.70, 105% of predicted FEV1 ratio was 79 FEF 25-75% liters per second was 97% of predicted  LUNG VOLUMES: TLC was 86% of predicted RV was 57% of predicted  DIFFUSION CAPACITY: DLCO was 108% of predicted DLCO/VA was 145% of predicted  FLOW VOLUME LOOP: Normal   Impression Overall normal study   PMHX:   No past medical history on file. Surgical Hx:  Past Surgical History:  Procedure Laterality Date  . CHOLECYSTECTOMY    . FLEXIBLE BRONCHOSCOPY N/A 02/05/2017   Procedure: FLEXIBLE BRONCHOSCOPY;  Surgeon: [name], MD;  Location: [facility] ORS;  Service: Pulmonary;  Laterality: N/A;   Family Hx:  Family History  Problem Relation Age of Onset  . Heart disease Mother   . Hyperlipidemia Mother   . Heart disease Father   . Heart disease Maternal Grandmother   . Heart attack Maternal Grandmother    Social Hx:   Social History  Substance Use Topics  . Smoking status: Never Smoker  . Smokeless tobacco: Never Used  . Alcohol use No   Medication:   Reviewed.     Allergies:  Penicillins  Review of Systems: Gen:  Denies  fever, sweats, chills HEENT: Denies blurred vision, double vision. bleeds, sore throat Cvc:  No dizziness, chest pain. Resp:   Denies  cough or sputum production, shortness of breath Gi: Denies swallowing difficulty, stomach pain. Gu:  Denies bladder incontinence, burning urine Ext:   No Joint pain, stiffness. Skin: No skin rash,  hives  Endoc:  No polyuria, polydipsia. Psych: No depression, insomnia. Other:  All other systems were reviewed with the patient and were negative other that what is mentioned in the HPI.   Physical Examination:   VS: BP 132/76 (BP  Location: Left Arm, Cuff Size: Normal)   Pulse 83   Ht _0  (1.702 m)   Wt 213 lb 9.6 oz (96.9 kg)   SpO2 96%   BMI 33.45 kg/m   General Appearance: No distress  Neuro:without focal findings,  speech normal,  HEENT: PERRLA, EOM intact.   Pulmonary: normal breath sounds, No wheezing.  CardiovascularNormal S1,S2.  No m/r/g.   Abdomen: Benign, Soft, non-tender. Renal:  No costovertebral tenderness  GU:  No performed at this time. Endoc: No evident thyromegaly, no signs of acromegaly. Skin:   warm, no rashes, no ecchymosis  Extremities: normal, no cyanosis, clubbing.  Other findings:    LABORATORY PANEL:   CBC No results for input(s): WBC, HGB, HCT, PLT in the last 168 hours. ------------------------------------------------------------------------------------------------------------------  Chemistries  No results for input(s): NA, K, CL, CO2, GLUCOSE, BUN, CREATININE, CALCIUM, MG, AST, ALT, ALKPHOS, BILITOT in the last 168 hours.  Invalid input(s): GFRCGP ------------------------------------------------------------------------------------------------------------------  Cardiac Enzymes No results for input(s): TROPONINI in the last 168 hours. ------------------------------------------------------------  RADIOLOGY:  No results found.     Thank  you for the consultation and for allowing [facility] to assist in the care of your patient. Our recommendations are noted above.  Please contact us if we can be of further service.   Marda Stalker, MD.  Board Certified in Internal Medicine, Pulmonary Medicine, [place], and Sleep Medicine.  [facility] Office Number: [phone]  Patricia Pesa, M.D.  Vilinda Boehringer, M.D.  Merton Border, M.D  02/19/2017

## 2017-02-19 NOTE — Patient Instructions (Addendum)
--  Continue mucinex, use claritin if needed.  --Can continue tessalon if needed for cough.  --Continue to try to increase your activity level.

## 2017-03-04 ENCOUNTER — Ambulatory Visit (HOSPITAL_COMMUNITY)
Admission: RE | Admit: 2017-03-04 | Discharge: 2017-03-04 | Disposition: A | Discharge status: Home / Self Care | Payer: Medicare Other | Source: Ambulatory Visit | Attending: Internal Medicine | Admitting: Internal Medicine

## 2017-03-04 ENCOUNTER — Encounter (HOSPITAL_COMMUNITY): Payer: Self-pay | Admitting: *Deleted

## 2017-03-04 ENCOUNTER — Encounter (HOSPITAL_COMMUNITY)
Admission: RE | Disposition: A | Discharge status: Home / Self Care | Payer: Self-pay | Source: Ambulatory Visit | Attending: Internal Medicine

## 2017-03-04 DIAGNOSIS — D123 Benign neoplasm of transverse colon: Secondary | ICD-10-CM

## 2017-03-04 DIAGNOSIS — K648 Other hemorrhoids: Secondary | ICD-10-CM | POA: Insufficient documentation

## 2017-03-04 DIAGNOSIS — D125 Benign neoplasm of sigmoid colon: Secondary | ICD-10-CM | POA: Insufficient documentation

## 2017-03-04 DIAGNOSIS — Z79899 Other long term (current) drug therapy: Secondary | ICD-10-CM | POA: Diagnosis not present

## 2017-03-04 DIAGNOSIS — Z8249 Family history of ischemic heart disease and other diseases of the circulatory system: Secondary | ICD-10-CM | POA: Diagnosis not present

## 2017-03-04 DIAGNOSIS — Z1211 Encounter for screening for malignant neoplasm of colon: Secondary | ICD-10-CM

## 2017-03-04 DIAGNOSIS — Z9049 Acquired absence of other specified parts of digestive tract: Secondary | ICD-10-CM | POA: Insufficient documentation

## 2017-03-04 DIAGNOSIS — K644 Residual hemorrhoidal skin tags: Secondary | ICD-10-CM | POA: Insufficient documentation

## 2017-03-04 HISTORY — PX: COLONOSCOPY: SHX5424

## 2017-03-04 SURGERY — COLONOSCOPY
Anesthesia: Moderate Sedation

## 2017-03-04 MED ORDER — MEPERIDINE HCL 50 MG/ML IJ SOLN
INTRAMUSCULAR | Status: AC
  Filled 2017-03-04: qty 1

## 2017-03-04 MED ORDER — MIDAZOLAM HCL 5 MG/5ML IJ SOLN
INTRAMUSCULAR | Status: AC
  Filled 2017-03-04: qty 10

## 2017-03-04 MED ORDER — MEPERIDINE HCL 50 MG/ML IJ SOLN
INTRAMUSCULAR | Status: DC | PRN
  Administered 2017-03-04 (×2): 25 mg via INTRAVENOUS

## 2017-03-04 MED ORDER — ATROPINE SULFATE 1 MG/ML IJ SOLN
INTRAMUSCULAR | Status: AC
  Filled 2017-03-04: qty 1

## 2017-03-04 MED ORDER — MIDAZOLAM HCL 5 MG/5ML IJ SOLN
INTRAMUSCULAR | Status: DC | PRN
  Administered 2017-03-04 (×2): 1 mg via INTRAVENOUS
  Administered 2017-03-04: 2 mg via INTRAVENOUS
  Administered 2017-03-04 (×2): 1 mg via INTRAVENOUS
  Administered 2017-03-04: 2 mg via INTRAVENOUS

## 2017-03-04 MED ORDER — SODIUM CHLORIDE 0.9 % IV SOLN
INTRAVENOUS | Status: DC
  Administered 2017-03-04: 1000 mL via INTRAVENOUS

## 2017-03-04 MED ORDER — MIDAZOLAM HCL 5 MG/5ML IJ SOLN
INTRAMUSCULAR | Status: AC
Start: 2017-03-04 — End: 2017-03-04
  Filled 2017-03-04: qty 5

## 2017-03-04 MED ORDER — ATROPINE SULFATE 1 MG/ML IJ SOLN
INTRAMUSCULAR | Status: DC | PRN
  Administered 2017-03-04: .5 mg via INTRAVENOUS

## 2017-03-04 NOTE — Op Note (Signed)
[facility] Patient Name: Tyrone Fox Procedure Date: 03/04/2017 8:15 AM MRN: [id number] Date of Birth: December 22, 1945 Attending MD: Hildred Laser , MD CSN: [id number] Age: 71 Admit Type: Outpatient Procedure:                Colonoscopy Indications:              Screening for colorectal malignant neoplasm Providers:                Hildred Laser, MD, Lurline Del, RN, Aram Candela Referring MD:             Irven Easterly. Kary Kos, MD Medicines:                Meperidine 50 mg IV, Midazolam 8 mg IV, Atropine                            0.5 mg IV Complications:            No immediate complications. Estimated Blood Loss:     Estimated blood loss was minimal. Procedure:                Pre-Anesthesia Assessment:                           - Prior to the procedure, a History and Physical                            was performed, and patient medications and                            allergies were reviewed. The patient's tolerance of                            previous anesthesia was also reviewed. The risks                            and benefits of the procedure and the sedation                            options and risks were discussed with the patient.                            All questions were answered, and informed consent                            was obtained. Prior Anticoagulants: The patient has                            taken no previous anticoagulant or antiplatelet                            agents. ASA Grade Assessment: II - A patient with                            mild systemic disease. After reviewing the risks  and benefits, the patient was deemed in                            satisfactory condition to undergo the procedure.                           After obtaining informed consent, the colonoscope                            was passed under direct vision. Throughout the                            procedure, the patient's blood pressure, pulse, and                             oxygen saturations were monitored continuously. The                            [id number] ([id number]) scope was introduced through                            the anus and advanced to the the ascending colon.                            The colonoscopy was technically difficult and                            complex due to significant looping and a tortuous                            colon. Successful completion of the procedure was                            aided by changing the patient's position. The                            ileocecal valve and the rectum were photographed.                            Appendiceal orifice and area behind the not seen.                            The quality of the bowel preparation was good. Scope In: 8:38:34 AM Scope Out: 9:43:32 AM Total Procedure Duration: 1 hour 4 minutes 58 seconds  Findings:      The perianal exam findings include skin tags.      A small polyp was found in the hepatic flexure. The polyp was sessile.       Biopsies were taken with a cold forceps for histology. The pathology       specimen was placed into Bottle Number 1.      A 6 to 10 mm polyp was found in the sigmoid colon. The polyp was       semi-pedunculated. The polyp was removed with a hot snare. Resection and  retrieval were complete. The pathology specimen was placed into Bottle       Number 1. To prevent bleeding after the polypectomy, one hemostatic clip       was successfully placed (MR conditional). There was no bleeding at the       end of the procedure.      Internal hemorrhoids were found during retroflexion. The hemorrhoids       were small. Impression:               - Exam incomplete as rea behind ileocecal valve and                            appendiceal orifice not seen.                           - Perianal skin tags found on perianal exam.                           - One small polyp at the hepatic flexure. Biopsied.                            - One 6 to 10 mm polyp in the sigmoid colon,                            removed with a hot snare. Resected and retrieved.                            Clip (MR conditional) was placed.                           - Internal hemorrhoids. Moderate Sedation:      Moderate (conscious) sedation was administered by the endoscopy nurse       and supervised by the endoscopist. The following parameters were       monitored: oxygen saturation, heart rate, blood pressure, CO2       capnography and response to care. Total physician intraservice time was       70 minutes. Recommendation:           - Patient has a contact number available for                            emergencies. The signs and symptoms of potential                            delayed complications were discussed with the                            patient. Return to normal activities tomorrow.                            Written discharge instructions were provided to the                            patient.                           -  Resume previous diet today.                           - Continue present medications.                           - No aspirin, ibuprofen, naproxen, or other                            non-steroidal anti-inflammatory drugs for 7 days                            after polyp removal.                           - Await pathology results.                           - Repeat colonoscopy for surveillance based on                            pathology results. Procedure Code(s):        --- Professional ---                           [phone], 52, Colonoscopy, flexible; with removal of                            tumor(s), polyp(s), or other lesion(s) by snare                            technique                           45380, 59,52, Colonoscopy, flexible; with biopsy,                            single or multiple                           99152, Moderate sedation services provided by the                             same physician or other qualified health care                            professional performing the diagnostic or                            therapeutic service that the sedation supports,                            requiring the presence of an independent trained                            observer to assist in the monitoring of the  patient's level of consciousness and physiological                            status; initial 15 minutes of intraservice time,                            patient age 59 years or older                           [phone], Moderate sedation services; each additional                            15 minutes intraservice time                           99153, Moderate sedation services; each additional                            15 minutes intraservice time                           99153, Moderate sedation services; each additional                            15 minutes intraservice time                           99153, Moderate sedation services; each additional                            15 minutes intraservice time Diagnosis Code(s):        --- Professional ---                           Z12.11, Encounter for screening for malignant                            neoplasm of colon                           D12.3, Benign neoplasm of transverse colon (hepatic                            flexure or splenic flexure)                           D12.5, Benign neoplasm of sigmoid colon                           K64.4, Residual hemorrhoidal skin tags                           K64.8, Other hemorrhoids CPT copyright 2016 [employer]. All rights reserved. The codes documented in this report are preliminary and upon coder review may  be revised to meet current compliance requirements. Hildred Laser, MD Hildred Laser, MD 03/04/2017 10:00:04 AM This report has been signed electronically.  Number of Addenda: 0 

## 2017-03-04 NOTE — H&P (Signed)
Tyrone Fox is an 71 y.o. male.   Chief Complaint: Patient is here for colonoscopy. HPI: Patient is 71 year old Caucasian male who is here for screening colonoscopy. He denies abdominal pain change in bowel habits or rectal bleeding. Last colonoscopy was normal 10 years ago. Family history is negative for CRC.  History reviewed. No pertinent past medical history.  Past Surgical History:  Procedure Laterality Date  . CHOLECYSTECTOMY    . FLEXIBLE BRONCHOSCOPY N/A 02/05/2017   Procedure: FLEXIBLE BRONCHOSCOPY;  Surgeon: Tyrone Hobby, MD;  Location: [facility] ORS;  Service: Pulmonary;  Laterality: N/A;  . TONSILLECTOMY      Family History  Problem Relation Age of Onset  . Heart disease Mother   . Hyperlipidemia Mother   . Heart disease Father   . Heart disease Maternal Grandmother   . Heart attack Maternal Grandmother    Social History:  reports that he has never smoked. He has never used smokeless tobacco. He reports that he does not drink alcohol or use drugs.  Allergies:  Allergies  Allergen Reactions  . Penicillins Hives and Itching    Has patient had a PCN reaction causing immediate rash, facial/tongue/throat swelling, SOB or lightheadedness with hypotension: {yes Has patient had a PCN reaction causing severe rash involving mucus membranes or skin necrosis: {Yno Has patient had a PCN reaction that required hospitalization no Has patient had a PCN reaction occurring within the last 10 years: {yes If all of the above answers are "NO", then may proceed with Cephalosporin use.    Medications Prior to Admission  Medication Sig Dispense Refill  . dextromethorphan-guaiFENesin (MUCINEX DM) 30-600 MG 12hr tablet Take 1 tablet by mouth 2 (two) times daily.    Marland Kitchen esomeprazole (NEXIUM) 20 MG capsule Take 40 mg by mouth daily.     . fluticasone (FLONASE) 50 MCG/ACT nasal spray Place 2 sprays into both nostrils at bedtime. (Patient taking differently: Place 2 sprays into both nostrils  daily as needed for allergies. ) 16 g 2  . loratadine (CLARITIN) 10 MG tablet Take 10 mg by mouth daily as needed for allergies.    . tamsulosin (FLOMAX) 0.4 MG CAPS capsule Take 0.4 mg by mouth 2 (two) times daily.     . VESICARE 5 MG tablet Take 5 mg by mouth daily.  1  . benzonatate (TESSALON) 200 MG capsule Take 1 capsule (200 mg total) by mouth 3 (three) times daily. (Patient not taking: Reported on 02/27/2017) 90 capsule 0    No results found for this or any previous visit (from the past 48 hour(s)). No results found.  ROS  Blood pressure (!) 153/97, pulse 71, temperature 97.7 F (36.5 C), temperature source Oral, resp. rate 17, height _0  (1.702 m), weight 211 lb (95.7 kg), SpO2 96 %. Physical Exam  Constitutional: He appears well-developed and well-nourished.  HENT:  Mouth/Throat: Oropharynx is clear and moist.  Eyes: Conjunctivae are normal. No scleral icterus.  Neck: No thyromegaly present.  Cardiovascular: Normal rate, regular rhythm and normal heart sounds.   No murmur heard. Respiratory: Effort normal and breath sounds normal.  GI:  Abdomen is full but soft and nontender without organomegaly or masses.  Musculoskeletal: He exhibits no edema.  Lymphadenopathy:    He has no cervical adenopathy.  Neurological: He is alert.  Skin: Skin is warm and dry.     Assessment/Plan Average risk screening colonoscopy.  Tyrone Laser, MD 03/04/2017, 8:30 AM

## 2017-03-04 NOTE — Discharge Instructions (Signed)
No aspirin or NSAIDs for 1 week. Resume usual medications and diet. No driving for 24 hours. Physician will call with biopsy results.   Colonoscopy, Adult, Care After This sheet gives you information about how to care for yourself after your procedure. Your doctor may also give you more specific instructions. If you have problems or questions, call your doctor. Follow these instructions at home: General instructions    For the first 24 hours after the procedure:  Do not drive or use machinery.  Do not sign important documents.  Do not drink alcohol.  Do your daily activities more slowly than normal.  Eat foods that are soft and easy to digest.  Rest often.  Take over-the-counter or prescription medicines only as told by your doctor.  It is up to you to get the results of your procedure. Ask your doctor, or the department performing the procedure, when your results will be ready. To help cramping and bloating:   Try walking around.  Put heat on your belly (abdomen) as told by your doctor. Use a heat source that your doctor recommends, such as a moist heat pack or a heating pad.  Put a towel between your skin and the heat source.  Leave the heat on for 20-30 minutes.  Remove the heat if your skin turns bright red. This is especially important if you cannot feel pain, heat, or cold. You can get burned. Eating and drinking   Drink enough fluid to keep your pee (urine) clear or pale yellow.  Return to your normal diet as told by your doctor. Avoid heavy or fried foods that are hard to digest.  Avoid drinking alcohol for as long as told by your doctor. Contact a doctor if:  You have blood in your poop (stool) 2-3 days after the procedure. Get help right away if:  You have more than a small amount of blood in your poop.  You see large clumps of tissue (blood clots) in your poop.  Your belly is swollen.  You feel sick to your stomach (nauseous).  You throw up  (vomit).  You have a fever.  You have belly pain that gets worse, and medicine does not help your pain. This information is not intended to replace advice given to you by your health care provider. Make sure you discuss any questions you have with your health care provider. Document Released: 11/22/2010 Document Revised: 07/14/2016 Document Reviewed: 07/14/2016 Elsevier Interactive Patient Education  2017 [employer].  Colon Polyps Polyps are tissue growths inside the body. Polyps can grow in many places, including the large intestine (colon). A polyp may be a round bump or a mushroom-shaped growth. You could have one polyp or several. Most colon polyps are noncancerous (benign). However, some colon polyps can become cancerous over time. What are the causes? The exact cause of colon polyps is not known. What increases the risk? This condition is more likely to develop in people who:  Have a family history of colon cancer or colon polyps.  Are older than 30 or older than 45 if they are African American.  Have inflammatory bowel disease, such as ulcerative colitis or Crohn disease.  Are overweight.  Smoke cigarettes.  Do not get enough exercise.  Drink too much alcohol.  Eat a diet that is:  High in fat and red meat.  Low in fiber.  Had childhood cancer that was treated with abdominal radiation. What are the signs or symptoms? Most polyps do not cause symptoms.  If you have symptoms, they may include:  Blood coming from your rectum when having a bowel movement.  Blood in your stool.The stool may look dark red or black.  A change in bowel habits, such as constipation or diarrhea. How is this diagnosed? This condition is diagnosed with a colonoscopy. This is a procedure that uses a lighted, flexible scope to look at the inside of your colon. How is this treated? Treatment for this condition involves removing any polyps that are found. Those polyps will then be tested  for cancer. If cancer is found, your health care provider will talk to you about options for colon cancer treatment. Follow these instructions at home: Diet   Eat plenty of fiber, such as fruits, vegetables, and whole grains.  Eat foods that are high in calcium and vitamin D, such as milk, cheese, yogurt, eggs, liver, fish, and broccoli.  Limit foods high in fat, red meats, and processed meats, such as hot dogs, sausage, bacon, and lunch meats.  Maintain a healthy weight, or lose weight if recommended by your health care provider. General instructions   Do not smoke cigarettes.  Do not drink alcohol excessively.  Keep all follow-up visits as told by your health care provider. This is important. This includes keeping regularly scheduled colonoscopies. Talk to your health care provider about when you need a colonoscopy.  Exercise every day or as told by your health care provider. Contact a health care provider if:  You have new or worsening bleeding during a bowel movement.  You have new or increased blood in your stool.  You have a change in bowel habits.  You unexpectedly lose weight. This information is not intended to replace advice given to you by your health care provider. Make sure you discuss any questions you have with your health care provider. Document Released: 07/16/2004 Document Revised: 03/27/2016 Document Reviewed: 09/10/2015 Elsevier Interactive Patient Education  2017 [employer].

## 2017-03-09 ENCOUNTER — Encounter (HOSPITAL_COMMUNITY): Payer: Self-pay | Admitting: Internal Medicine

## 2017-03-21 LAB — ACID FAST CULTURE WITH REFLEXED SENSITIVITIES (MYCOBACTERIA): Acid Fast Culture: NEGATIVE

## 2017-03-21 LAB — ACID FAST CULTURE WITH REFLEXED SENSITIVITIES

## 2017-06-08 ENCOUNTER — Other Ambulatory Visit: Payer: Self-pay | Admitting: Student

## 2017-06-08 ENCOUNTER — Ambulatory Visit
Admission: RE | Admit: 2017-06-08 | Discharge: 2017-06-08 | Disposition: A | Discharge status: Home / Self Care | Payer: Medicare Other | Source: Ambulatory Visit | Attending: Student | Admitting: Student

## 2017-06-08 DIAGNOSIS — M79661 Pain in right lower leg: Secondary | ICD-10-CM

## 2017-06-08 DIAGNOSIS — R600 Localized edema: Secondary | ICD-10-CM | POA: Diagnosis not present

## 2017-06-08 DIAGNOSIS — I82431 Acute embolism and thrombosis of right popliteal vein: Secondary | ICD-10-CM | POA: Insufficient documentation

## 2017-06-19 ENCOUNTER — Emergency Department
Admission: EM | Admit: 2017-06-19 | Discharge: 2017-06-19 | Disposition: A | Discharge status: Home / Self Care | Payer: Medicare Other | Attending: Emergency Medicine | Admitting: Emergency Medicine

## 2017-06-19 ENCOUNTER — Emergency Department: Payer: Medicare Other

## 2017-06-19 ENCOUNTER — Other Ambulatory Visit: Payer: Self-pay

## 2017-06-19 ENCOUNTER — Encounter: Payer: Self-pay | Admitting: Emergency Medicine

## 2017-06-19 DIAGNOSIS — Z79899 Other long term (current) drug therapy: Secondary | ICD-10-CM | POA: Insufficient documentation

## 2017-06-19 DIAGNOSIS — I82409 Acute embolism and thrombosis of unspecified deep veins of unspecified lower extremity: Secondary | ICD-10-CM | POA: Diagnosis not present

## 2017-06-19 DIAGNOSIS — R531 Weakness: Secondary | ICD-10-CM | POA: Diagnosis present

## 2017-06-19 DIAGNOSIS — Z7901 Long term (current) use of anticoagulants: Secondary | ICD-10-CM | POA: Diagnosis not present

## 2017-06-19 DIAGNOSIS — R29810 Facial weakness: Secondary | ICD-10-CM | POA: Diagnosis not present

## 2017-06-19 DIAGNOSIS — I639 Cerebral infarction, unspecified: Secondary | ICD-10-CM | POA: Insufficient documentation

## 2017-06-19 LAB — CBC
HCT: 46.8 % (ref 40.0–52.0)
Hemoglobin: 16.2 g/dL (ref 13.0–18.0)
MCH: 31.4 pg (ref 26.0–34.0)
MCHC: 34.6 g/dL (ref 32.0–36.0)
MCV: 90.7 fL (ref 80.0–100.0)
PLATELETS: 255 10*3/uL (ref 150–440)
RBC: 5.16 MIL/uL (ref 4.40–5.90)
RDW: 13.6 % (ref 11.5–14.5)
WBC: 8.8 10*3/uL (ref 3.8–10.6)

## 2017-06-19 LAB — COMPREHENSIVE METABOLIC PANEL
ALBUMIN: 4 g/dL (ref 3.5–5.0)
ALT: 32 U/L (ref 17–63)
ANION GAP: 9 (ref 5–15)
AST: 41 U/L (ref 15–41)
Alkaline Phosphatase: 38 U/L (ref 38–126)
BUN: 14 mg/dL (ref 6–20)
CHLORIDE: 108 mmol/L (ref 101–111)
CO2: 23 mmol/L (ref 22–32)
Calcium: 9.4 mg/dL (ref 8.9–10.3)
Creatinine, Ser: 1.29 mg/dL — ABNORMAL HIGH (ref 0.61–1.24)
GFR calc Af Amer: >60 mL/min (ref >60)
GFR calc non Af Amer: 54 mL/min — ABNORMAL LOW (ref >60)
GLUCOSE: 214 mg/dL — AB (ref 65–99)
POTASSIUM: 4.1 mmol/L (ref 3.5–5.1)
SODIUM: 140 mmol/L (ref 135–145)
TOTAL PROTEIN: 7.6 g/dL (ref 6.5–8.1)
Total Bilirubin: 3.6 mg/dL — ABNORMAL HIGH (ref 0.3–1.2)

## 2017-06-19 LAB — DIFFERENTIAL
BASOS ABS: 0.1 10*3/uL (ref 0–0.1)
Basophils Relative: 1 %
EOS ABS: 0.1 10*3/uL (ref 0–0.7)
EOS PCT: 1 %
Lymphocytes Relative: 17 %
Lymphs Abs: 1.5 10*3/uL (ref 1.0–3.6)
Monocytes Absolute: 0.8 10*3/uL (ref 0.2–1.0)
Monocytes Relative: 9 %
NEUTROS PCT: 72 %
Neutro Abs: 6.3 10*3/uL (ref 1.4–6.5)

## 2017-06-19 LAB — APTT: APTT: 41 s — AB (ref 24–36)

## 2017-06-19 LAB — ETHANOL: Alcohol, Ethyl (B): <5 mg/dL (ref <5)

## 2017-06-19 LAB — PROTIME-INR
INR: 2.5
PROTHROMBIN TIME: 27.5 s — AB (ref 11.4–15.2)

## 2017-06-19 MED ORDER — IOPAMIDOL (ISOVUE-370) INJECTION 76%
75.0000 mL | Freq: Once | INTRAVENOUS | Status: AC | PRN
  Administered 2017-06-19: 75 mL via INTRAVENOUS

## 2017-06-19 NOTE — Discharge Instructions (Addendum)
Today I recommended that you be admitted to the hospital for the stroke that you had, however you decided to leave the hospital [place].  Please know if you change your mind you are more than welcome to come back to our ER and we are more than happy to continue your care.  If you do fly to [place] recommend you go to the nearest ER first thing in the morning for a recheck.  Please keep taking your xarelto and make an appointment to follow up with Neurology here in [place] on your return.  It was a pleasure to take care of you today, and thank you for coming to our emergency department.  If you have any questions or concerns before leaving please ask the nurse to grab me and I'm more than happy to go through your aftercare instructions again.  If you were prescribed any opioid pain medication today such as Norco, Vicodin, Percocet, morphine, hydrocodone, or oxycodone please make sure you do not drive when you are taking this medication as it can alter your ability to drive safely.  If you have any concerns once you are home that you are not improving or are in fact getting worse before you can make it to your follow-up appointment, please do not hesitate to call 911 and come back for further evaluation.  Darel Hong, MD  Results for orders placed or performed during the hospital encounter of 06/19/17  Ethanol  Result Value Ref Range   Alcohol, Ethyl (B) <5 <5 mg/dL  Protime-INR  Result Value Ref Range   Prothrombin Time 27.5 (H) 11.4 - 15.2 seconds   INR 2.50   APTT  Result Value Ref Range   aPTT 41 (H) 24 - 36 seconds  CBC  Result Value Ref Range   WBC 8.8 3.8 - 10.6 K/uL   RBC 5.16 4.40 - 5.90 MIL/uL   Hemoglobin 16.2 13.0 - 18.0 g/dL   HCT 46.8 40.0 - 52.0 %   MCV 90.7 80.0 - 100.0 fL   MCH 31.4 26.0 - 34.0 pg   MCHC 34.6 32.0 - 36.0 g/dL   RDW 13.6 11.5 - 14.5 %   Platelets 255 150 - 440 K/uL  Differential  Result Value Ref Range   Neutrophils Relative % 72 %   Neutro Abs 6.3 1.4 - 6.5 K/uL   Lymphocytes Relative 17 %   Lymphs Abs 1.5 1.0 - 3.6 K/uL   Monocytes Relative 9 %   Monocytes Absolute 0.8 0.2 - 1.0 K/uL   Eosinophils Relative 1 %   Eosinophils Absolute 0.1 0 - 0.7 K/uL   Basophils Relative 1 %   Basophils Absolute 0.1 0 - 0.1 K/uL  Comprehensive metabolic panel  Result Value Ref Range   Sodium 140 135 - 145 mmol/L   Potassium 4.1 3.5 - 5.1 mmol/L   Chloride 108 101 - 111 mmol/L   CO2 23 22 - 32 mmol/L   Glucose, Bld 214 (H) 65 - 99 mg/dL   BUN 14 6 - 20 mg/dL   Creatinine, Ser 1.29 (H) 0.61 - 1.24 mg/dL   Calcium 9.4 8.9 - 10.3 mg/dL   Total Protein 7.6 6.5 - 8.1 g/dL   Albumin 4.0 3.5 - 5.0 g/dL   AST 41 15 - 41 U/L   ALT 32 17 - 63 U/L   Alkaline Phosphatase 38 38 - 126 U/L   Total Bilirubin 3.6 (H) 0.3 - 1.2 mg/dL   GFR calc non  Af Amer 54 (L) >60 mL/min   GFR calc Af Amer >60 >60 mL/min   Anion gap 9 5 - 15   Ct Head Wo Contrast  Result Date: 06/19/2017 CLINICAL DATA:  Onset of weakness, slurred speech and facial droop yesterday while working the patient was working in his yard. EXAM: CT HEAD WITHOUT CONTRAST TECHNIQUE: Contiguous axial images were obtained from the base of the skull through the vertex without intravenous contrast. COMPARISON:  None. FINDINGS: Brain: There is some chronic microvascular ischemic change. No evidence of acute intracranial abnormality including hemorrhage, infarct, mass lesion, mass effect, midline shift or abnormal extra-axial fluid collection. Vascular: No hyperdense vessel or unexpected calcification. Skull: Intact. Sinuses/Orbits: Negative. Other: None. IMPRESSION: No acute abnormality. Mild chronic microvascular ischemic change. Electronically Signed   By: Inge Rise M.D.   On: 06/19/2017 10:56   Ct Angio Neck W And/or Wo Contrast  Result Date: 06/19/2017 CLINICAL DATA:  Arterial stricture/occlusion head neck. Facial droop, slurred speech EXAM: CT  ANGIOGRAPHY NECK TECHNIQUE: Multidetector CT imaging of the neck was performed using the standard protocol during bolus administration of intravenous contrast. Multiplanar CT image reconstructions and MIPs were obtained to evaluate the vascular anatomy. Carotid stenosis measurements (when applicable) are obtained utilizing NASCET criteria, using the distal internal carotid diameter as the denominator. CONTRAST:  75 mL Isovue 370 IV COMPARISON:  CT head 06/19/2017 FINDINGS: Aortic arch: Minimal atherosclerotic disease in the aortic arch. No aneurysm or dissection. Proximal great vessels patent Right carotid system: Mild atherosclerotic calcification right carotid bifurcation without stenosis Left carotid system: Normal left carotid system without stenosis or atherosclerotic disease Vertebral arteries: Normal Skeleton: Cervical spine disc degeneration. No acute skeletal abnormality. Other neck: Negative Upper chest: Scarring right apex.  No acute pulmonary abnormality. IMPRESSION: No significant carotid or vertebral artery stenosis in the neck. Electronically Signed   By: Franchot Gallo M.D.   On: 06/19/2017 12:09   US Venous Img Lower Unilateral Right  Result Date: 06/08/2017 CLINICAL DATA:  Right calf pain and edema for 1 week. Evaluate for DVT. EXAM: RIGHT LOWER EXTREMITY VENOUS DOPPLER ULTRASOUND TECHNIQUE: Gray-scale sonography with graded compression, as well as color Doppler and duplex ultrasound were performed to evaluate the lower extremity deep venous systems from the level of the common femoral vein and including the common femoral, femoral, profunda femoral, popliteal and calf veins including the posterior tibial, peroneal and gastrocnemius veins when visible. The superficial great saphenous vein was also interrogated. Spectral Doppler was utilized to evaluate flow at rest and with distal augmentation maneuvers in the common femoral, femoral and popliteal veins. COMPARISON:  None. FINDINGS:  Contralateral Common Femoral Vein: Respiratory phasicity is normal and symmetric with the symptomatic side. No evidence of thrombus. Normal compressibility. Common Femoral Vein: No evidence of thrombus. Normal compressibility, respiratory phasicity and response to augmentation. Saphenofemoral Junction: No evidence of thrombus. Normal compressibility and flow on color Doppler imaging. Profunda Femoral Vein: No evidence of thrombus. Normal compressibility and flow on color Doppler imaging. Femoral Vein: No evidence of thrombus. Normal compressibility, respiratory phasicity and response to augmentation. Popliteal Vein: There is mixed echogenic occlusive thrombus within the mid and distal aspects of the popliteal vein (images 27 and 28) Calf Veins: There is mixed echogenic occlusive thrombus within both paired right posterior tibial vein (images 32 and 33). The right peroneal vein appears patent were imaged. Superficial Great Saphenous Vein: No evidence of thrombus. Normal compressibility and flow on color Doppler imaging. Other Findings:  None. 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 Electronically Signed   By: Sandi Mariscal M.D.   On: 06/08/2017 16:38

## 2017-06-19 NOTE — ED Triage Notes (Signed)
First Nurse Note:  Arrives c/o stroke like symptoms that started yesterday afternoon.  Left sided facial droop seen and speech slurred.

## 2017-06-19 NOTE — ED Triage Notes (Signed)
States he was working in yard yesterday  Became weak  Family noticed some facial drooping and slurred speech yesterday  Per family the sx's are slightly improved

## 2017-06-19 NOTE — ED Notes (Signed)
Wife reports pt previously told he had blood clot in R leg

## 2017-06-19 NOTE — ED Provider Notes (Signed)
[facility] Emergency Department Provider Note  ____________________________________________   First MD Initiated Contact with Patient 06/19/17 1013     (approximate)  I have reviewed the triage vital signs and the nursing notes.   HISTORY  Chief Complaint Weakness   HPI Tyrone Fox is a 71 y.o. male who comes to the emergency department with left facial droop and slurred speech that began at 3 PM yesterday roughly 18 hours prior to arrival. He has a recent diagnosis of a deep vein thrombosis and began taking xarelto 7 days ago.He denies tinnitus. He denies tick exposure. He denies chest pain or shortness of breath. He has no past medical history of stroke or heart attack. His symptoms began gradually and has been consistent ever since. He denies numbness or weakness. He denies headache. Nothing seems to make them better or worse.   History reviewed. No pertinent past medical history.  Patient Active Problem List   Diagnosis Date Noted  . Special screening for malignant neoplasms, colon 07/02/2016    Past Surgical History:  Procedure Laterality Date  . CHOLECYSTECTOMY    . COLONOSCOPY N/A 03/04/2017   Procedure: COLONOSCOPY;  Surgeon: Rogene Houston, MD;  [facility];  Service: Endoscopy;  Laterality: N/A;  830  . FLEXIBLE BRONCHOSCOPY N/A 02/05/2017   Procedure: FLEXIBLE BRONCHOSCOPY;  Surgeon: [name], MD;  Location: [facility] ORS;  Service: Pulmonary;  Laterality: N/A;  . TONSILLECTOMY      Prior to Admission medications   Medication Sig Start Date End Date Taking? Authorizing Provider  dextromethorphan-guaiFENesin (MUCINEX DM) 30-600 MG 12hr tablet Take 1 tablet by mouth 2 (two) times daily.   Yes [provider]  esomeprazole (NEXIUM) 20 MG capsule Take 40 mg by mouth daily.    Yes [provider]  fluticasone (FLONASE) 50 MCG/ACT nasal spray Place 2 sprays into both nostrils at bedtime. Patient taking  differently: Place 2 sprays into both nostrils daily as needed for allergies.  01/08/17  Yes [name], MD  loratadine (CLARITIN) 10 MG tablet Take 10 mg by mouth daily as needed for allergies.   Yes [provider]  Rivaroxaban (XARELTO) 15 MG TABS tablet Take 15 mg by mouth 2 (two) times daily with a meal. Take 15 mg by mouth twice daily with meals for 21 days. 06/08/17 06/29/17 Yes [provider]  tamsulosin (FLOMAX) 0.4 MG CAPS capsule Take 0.4 mg by mouth 2 (two) times daily.    Yes [provider]  VESICARE 5 MG tablet Take 5 mg by mouth daily. 10/22/16  Yes [provider]    Allergies Penicillins  Family History  Problem Relation Age of Onset  . Heart disease Mother   . Hyperlipidemia Mother   . Heart disease Father   . Heart disease Maternal Grandmother   . Heart attack Maternal Grandmother     Social History Social History  Substance Use Topics  . Smoking status: Never Smoker  . Smokeless tobacco: Never Used  . Alcohol use No    Review of Systems Constitutional: No fever/chills Eyes: No visual changes. ENT: No sore throat. Cardiovascular: Denies chest pain. Respiratory: Denies shortness of breath. Gastrointestinal: No abdominal pain.  No nausea, no vomiting.  No diarrhea.  No constipation. Genitourinary: Negative for dysuria. Musculoskeletal: Negative for back pain. Skin: Negative for rash. Neurological: Negative for headache positive for focal weakness   ____________________________________________   PHYSICAL EXAM:  VITAL SIGNS: ED Triage Vitals [06/19/17 1011]  Enc Vitals  Group     BP 133/90     Pulse Rate 75     Resp 20     Temp 98.2 F (36.8 C)     Temp Source Oral     SpO2 95 %     Weight 217 lb (98.4 kg)     Height _0  (1.727 m)     Head Circumference      Peak Flow      Pain Score      Pain Loc      Pain Edu?      Excl. in [place]?     Constitutional: Alert and oriented 4 pleasant cooperative  speaks in full sentences although with somewhat slurred speech Eyes: PERRL EOMI. Head: Atraumatic. Nose: No congestion/rhinnorhea. Mouth/Throat: No trismus Neck: No stridor.   Cardiovascular: Normal rate, regular rhythm. Grossly normal heart sounds.  Good peripheral circulation. Respiratory: Normal respiratory effort.  No retractions. Lungs CTAB and moving good air Gastrointestinal: Soft nontender Musculoskeletal: No lower extremity edema   Neurologic:   Left-sided facial droop sparing the forehead slurred speech cranial nerves II through XII otherwise intact Alert and oriented 4 Mild pronator drift on the left normal on the right 5 out of 5 grips, biceps, triceps, hip flexion, hip extension plantar flexion, dorsiflexion Sensation intact to light touch throughout 2+ DTRs and no ankle clonus Normal finger-nose-finger Ambulates with steady gait Skin:  Skin is warm, dry and intact. No rash noted. Psychiatric: Mood and affect are normal. Speech and behavior are normal.    ____________________________________________   DIFFERENTIAL includes but not limited to  Stroke, Bell's palsy, lung disease, transient ischemic attack, carotid dissection ____________________________________________   LABS (all labs ordered are listed, but only abnormal results are displayed)  Labs Reviewed  PROTIME-INR - Abnormal; Notable for the following:       Result Value   Prothrombin Time 27.5 (*)    All other components within normal limits  APTT - Abnormal; Notable for the following:    aPTT 41 (*)    All other components within normal limits  COMPREHENSIVE METABOLIC PANEL - Abnormal; Notable for the following:    Glucose, Bld 214 (*)    Creatinine, Ser 1.29 (*)    Total Bilirubin 3.6 (*)    GFR calc non Af Amer 54 (*)    All other components within normal limits  ETHANOL  CBC  DIFFERENTIAL  URINALYSIS, ROUTINE W REFLEX MICROSCOPIC    No signs of acute ischemia or  infection __________________________________________  EKG  ED ECG REPORT I, Darel Hong, the attending physician, personally viewed and interpreted this ECG.  Date: 06/19/2017 Rate: 76 Rhythm: normal sinus rhythm QRS Axis: normal Intervals: normal ST/T Wave abnormalities: normal Narrative Interpretation: unremarkable  ____________________________________________  RADIOLOGY  CT of the head with no acute disease CT angiogram of the neck no acute disease ____________________________________________   PROCEDURES  Procedure(s) performed: no  Procedures  Critical Care performed: no  Observation: no ____________________________________________   INITIAL IMPRESSION / ASSESSMENT AND PLAN / ED COURSE  Pertinent labs & imaging results that were available during my care of the patient were reviewed by me and considered in my medical decision making (see chart for details).  On arrival the patient is 18 hours out from what appears to be an ischemic stroke. He has no suggestion of large vessel occlusion. Non-con CT scan as well as     ----------------------------------------- 11:15 AM on 06/19/2017 ----------------------------------------- I discussed the case with on-call neurologist Dr.  Reynolds who recommends inpatient admission for the patient for full stroke workup including an echocardiogram given the possibility of an intracardiac shunt with a new deep vein thrombosis. I explained to Dr. Doy Mince of the patient will not stay in our emergency department beyond 1 PM today secondary to a 3 week flight to visit his grandchildren in [place]. She said at that point she would recommend a CT angiogram of the patient's neck to determine if he had any significant stenosis that would require antiplatelet agents. She said if the CT scan were negative she would not recommend aspirin or Plavix.   ----------------------------------------- 12:52 PM on  06/19/2017 -----------------------------------------  I had a lengthy discussion with the patient and his wife at bedside regarding his diagnosis of stroke and the appropriate workup. I recommended inpatient admission to the patient however he declined stating he would continue taking his xarelto and he wanted to go on his vacation. I then told him that if he went on his vacation I very strongly recommended that he go to an emergency department tomorrow for an evaluation and possible inpatient admission for stroke workup while in [place] with family. The patient is alert and oriented 4 and has capacity to make medical decisions. He is asking for information for neurology follow-up here in [place] once he returns. He is discharged [place].  ____________________________________________   FINAL CLINICAL IMPRESSION(S) / ED DIAGNOSES  Final diagnoses:  Cerebrovascular accident (CVA), unspecified mechanism ([place])      NEW MEDICATIONS STARTED DURING THIS VISIT:  New Prescriptions   No medications on file     Note:  This document was prepared using Dragon voice recognition software and may include unintentional dictation errors.     Darel Hong, MD 06/19/17 1309

## 2017-06-19 NOTE — ED Notes (Signed)
Patient transported to CT 

## 2017-07-16 ENCOUNTER — Encounter: Payer: Self-pay | Admitting: Physical Therapy

## 2017-07-16 ENCOUNTER — Ambulatory Visit: Payer: Medicare Other | Attending: Family Medicine | Admitting: Physical Therapy

## 2017-07-16 DIAGNOSIS — R262 Difficulty in walking, not elsewhere classified: Secondary | ICD-10-CM | POA: Diagnosis present

## 2017-07-16 DIAGNOSIS — R278 Other lack of coordination: Secondary | ICD-10-CM | POA: Diagnosis present

## 2017-07-16 DIAGNOSIS — M6281 Muscle weakness (generalized): Secondary | ICD-10-CM | POA: Insufficient documentation

## 2017-07-16 NOTE — Therapy (Signed)
[place] MAIN [facility] SERVICES [address] [place], [place], [address] Phone: [phone]   Fax:  [phone]  Physical Therapy Evaluation  Patient Details  Name: Tyrone Fox MRN: [id number] Date of Birth: 1946/03/25 Referring Provider: [name]  Encounter Date: 07/16/2017      PT End of Session - 07/16/17 1419    Visit Number 1   Number of Visits 25   Date for PT Re-Evaluation 10/08/17   Authorization Type 1/10 g codes   PT Start Time 0200   PT Stop Time 0300   PT Time Calculation (min) 60 min   Equipment Utilized During Treatment Gait belt   Activity Tolerance Patient tolerated treatment well;Patient limited by fatigue   Behavior During Therapy [facility] for tasks assessed/performed      History reviewed. No pertinent past medical history.  Past Surgical History:  Procedure Laterality Date  . CHOLECYSTECTOMY    . COLONOSCOPY N/A 03/04/2017   Procedure: COLONOSCOPY;  Surgeon: Rogene Houston, MD;  [facility];  Service: Endoscopy;  Laterality: N/A;  830  . FLEXIBLE BRONCHOSCOPY N/A 02/05/2017   Procedure: FLEXIBLE BRONCHOSCOPY;  Surgeon: [name], MD;  Location: [facility] ORS;  Service: Pulmonary;  Laterality: N/A;  . TONSILLECTOMY      There were no vitals filed for this visit.       Subjective Assessment - 07/16/17 1409    Subjective Patient reports he has less strength LLE and LUE.  Patient is walking with a spc. He is not able to work out in his yard. He used to do light hiking and camping.    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 He was there 24 hours. Then he stayed at his daughter for 3 weeks. He went to out patient therapy 1 time and it was decided that her should wait unitl he flew back home to have the therapy. He got home from [place] stated spet 5th.    Limitations  Standing;Walking   How long can you sit comfortably? unlimited   How long can you stand comfortably? he gets tired but at least 30 mins   How long can you walk comfortably? intermediate distances with spc   Patient Stated Goals Patient wants to be able to walk long distances, have more energy, stay awake.   Currently in Pain? No/denies   Multiple Pain Sites No            [facility] PT Assessment - 07/16/17 1414      Assessment   Medical Diagnosis cva   Referring Provider [name]   Onset Date/Surgical Date 06/18/17   Hand Dominance Right   Next MD Visit october 15,2018   Prior Therapy 1 session     Restrictions   Weight Bearing Restrictions No     Balance Screen   Has the patient fallen in the past 6 months Yes   How many times? --  1   Has the patient had a decrease in activity level because of a fear of falling?  Yes   Is the patient reluctant to leave their home because of a fear of falling?  No     Home Environment   Living Environment Private residence   Available Help at Discharge Family   Type of [place] to enter  Entrance Stairs-Number of Steps 2  2   Entrance Stairs-Rails Right   Home Layout One level   [place] - single point;Grab bars - tub/shower     Prior Function   Level of Independence Independent with household mobility with device   Vocation Requirements --  retired   Leisure hiking, camping       POSTURE: WNL   PROM: WNL BUE and BLE AROM: WFL BUE and BLE  STRENGTH:  Graded on a 0-5 scale Muscle Group Left Right  Shoulder flex 4+/5 5/5  Shoulder Abd 4+/5 5/5  Shoulder Ext 4+/5 5/5  Shoulder IR/ER 4+/5 5/5  Elbow 4+/5 5/5  Wrist/hand 5/5 5/5  Hip Flex -4/5 4/5  Hip Abd -4/5 4/5  Hip Add 3/5 2/5  Hip Ext 3/5 3/5  Hip IR/ER 4/5 4/5  Knee Flex 5/5 5/5  Knee Ext 5/5 5/5  Ankle DF 5/5 5/5  Ankle PF 5/5 5/5   SENSATION: WNL BLE and BUE  FUNCTIONAL MOBILITY: rolling and supine to prone is slow  and scooting is slow transfes sit to stand with spc and SBA  GAIT:  Pateint is using a spc for ambulation outside and uses furniture to hold on to when ambulating indoors. He is able to ambulate intermediate distances with spc.    OUTCOME MEASURES: TEST Outcome Interpretation  5 times sit<>stand 20.39 sec Without  spc >60 yo, >15 sec indicates increased risk for falls  10 meter walk test 1.27 with spc                m/s <1.0 m/s indicates increased risk for falls; limited community ambulator  Timed up and Go     16.88  With spc          sec <14 sec indicates increased risk for falls  6 minute walk test   1020     With spc        Feet 1000 feet is community ambulator          BALANCE:  Static Standing Balance  Normal Able to maintain standing balance against maximal resistance   Good Able to maintain standing balance against moderate resistance x  Good-/Fair+ Able to maintain standing balance against minimal resistance   Fair Able to stand unsupported without UE support and without LOB for 1-2 min   Fair- Requires Min A and UE support to maintain standing without loss of balance   Poor+ Requires mod A and UE support to maintain standing without loss of balance   Poor Requires max A and UE support to maintain standing balance without loss    Standing Dynamic Balance  Normal Stand independently unsupported, able to weight shift and cross midline maximally   Good Stand independently unsupported, able to weight shift and cross midline moderately x  Good-/Fair+ Stand independently unsupported, able to weight shift across midline minimally   Fair Stand independently unsupported, weight shift, and reach ipsilaterally, loss of balance when crossing midline   Poor+ Able to stand with Min A and reach ipsilaterally, unable to weight shift   Poor Able to stand with Mod A and minimally reach ipsilaterally, unable to cross midline.           Objective measurements completed on examination:  See above findings.   Treatment:  HEP of corner balance exercises with modified tandem and head turns  Standing hip abd with GTB left and right x 15  Standing hip extension with GTB left and right x  15  Patient has fatigue with standing exercises, no reports of pain               PT Education - 07/16/17 1419    Education provided Yes   Education Details plan of care   Person(s) Educated Patient   Methods Explanation   Comprehension Verbalized understanding          PT Short Term Goals - 07/16/17 1543      PT SHORT TERM GOAL #1   Title Patient will reduce timed up and go to <13 seconds to reduce fall risk and demonstrate improved transfer/gait ability.   Baseline 15.88 sec   Time 6   Period Weeks   Status New   Target Date 08/27/17     PT SHORT TERM GOAL #2   Title Patient (> 32 years old) will complete five times sit to stand test in < 18 seconds indicating an increased LE strength and improved balance.   Baseline 20.39 sec   Time 6   Period Weeks   Status New   Target Date 08/27/17     PT SHORT TERM GOAL #3   Title Patient will ambulate without spc indoors to be able to ambulate household distances and carry items.    Baseline Patient ambulates wiht spc   Time 6   Period Weeks   Status New   Target Date 08/27/17           PT Long Term Goals - 07/16/17 1532      PT LONG TERM GOAL #1   Title Patient will be independent in home exercise program to improve strength/mobility for better functional independence with ADLs.   Time 12   Period Weeks   Status New   Target Date 10/08/17     PT LONG TERM GOAL #2   Title Patient (> 21 years old) will complete five times sit to stand test in < 15 seconds indicating an increased LE strength and improved balance.   Baseline 20.39 sec   Time 12   Period Weeks   Status New   Target Date 10/08/17     PT LONG TERM GOAL #3   Title Patient will reduce timed up and go to <11 seconds to reduce fall risk and  demonstrate improved transfer/gait ability.   Baseline 15.88 sec   Time 12   Period Weeks   Status New   Target Date 10/08/17     PT LONG TERM GOAL #4   Title Patient will ambulate wihtout AD for outside distances and uneven surfaces to be able to play golf.    Baseline Patient uses spc and ambulates intermediate distances.   Time 12   Period Weeks   Status New   Target Date 10/08/17                Plan - 07/16/17 1448    Clinical Impression Statement Pateitn is 71 year old male with DX of CVA 06/18/17 and referral for PT. He has reports of mild dizziness, decreased strength LLE,and LUE , decreased coordination LUE and LLE, increased falls indicated by outcome measures, decreased gait distances with spc.  He will benefit from skilled PT to improve balance, gait , strength and mobility.    History and Personal Factors relevant to plan of care: steps, age   Clinical Presentation Evolving   Clinical Presentation due to: patient has dizziness    Clinical Decision Making Moderate   Rehab Potential Good   PT Frequency  2x / week   PT Duration 12 weeks   PT Treatment/Interventions Aquatic Therapy;Cryotherapy;Moist Heat;Gait training;Stair training;Functional mobility training;Neuromuscular re-education;Balance training;Therapeutic exercise;Therapeutic activities;Patient/family education;Manual techniques   PT Next Visit Plan balance training, therapeutic exercise   Consulted and Agree with Plan of Care Patient;Family member/caregiver      Patient will benefit from skilled therapeutic intervention in order to improve the following deficits and impairments:  Decreased balance, Decreased endurance, Decreased mobility, Difficulty walking, Dizziness, Decreased activity tolerance, Decreased strength, Abnormal gait  Visit Diagnosis: Muscle weakness (generalized)  Difficulty in walking, not elsewhere classified      G-Codes - 2017/07/24 1527    Functional Assessment Tool Used  (Outpatient Only) TUG, 10 WW, 6 MW, 5 x sit to stand   Functional Limitation Mobility: Walking and moving around   Mobility: Walking and Moving Around Current Status [phone]) At least 20 percent but less than 40 percent impaired, limited or restricted   Mobility: Walking and Moving Around Goal Status [phone]) At least 1 percent but less than 20 percent impaired, limited or restricted       Problem List Patient Active Problem List   Diagnosis Date Noted  . Special screening for malignant neoplasms, colon 07/02/2016    Alanson Puls, PT DPT 24-Jul-2017, 3:56 PM  [place] MAIN [facility] SERVICES [address] [place], [place], [address] Phone: [phone]   Fax:  [phone]  Name: Tyrone Fox MRN: [id number] Date of Birth: [birth year]

## 2017-07-20 ENCOUNTER — Ambulatory Visit: Payer: Medicare Other | Admitting: Physical Therapy

## 2017-07-20 ENCOUNTER — Encounter: Payer: Self-pay | Admitting: Physical Therapy

## 2017-07-20 DIAGNOSIS — R262 Difficulty in walking, not elsewhere classified: Secondary | ICD-10-CM

## 2017-07-20 DIAGNOSIS — M6281 Muscle weakness (generalized): Secondary | ICD-10-CM

## 2017-07-20 NOTE — Therapy (Signed)
[place] MAIN [facility] SERVICES [address] [place], [place], [address] Phone: [phone]   Fax:  [phone]  Physical Therapy Treatment  Patient Details  Name: Tyrone Fox MRN: [id number] Date of Birth: 1946/04/02 Referring Provider: [name]  Encounter Date: 07/20/2017      PT End of Session - 07/20/17 0939    Visit Number 2   Number of Visits 25   Date for PT Re-Evaluation 10/08/17   Authorization Type 2/10 g codes   PT Start Time 0935   PT Stop Time 1013   PT Time Calculation (min) 38 min   Equipment Utilized During Treatment Gait belt   Activity Tolerance Patient tolerated treatment well;Patient limited by fatigue   Behavior During Therapy [facility] for tasks assessed/performed      History reviewed. No pertinent past medical history.  Past Surgical History:  Procedure Laterality Date  . CHOLECYSTECTOMY    . COLONOSCOPY N/A 03/04/2017   Procedure: COLONOSCOPY;  Surgeon: Rogene Houston, MD;  [facility];  Service: Endoscopy;  Laterality: N/A;  830  . FLEXIBLE BRONCHOSCOPY N/A 02/05/2017   Procedure: FLEXIBLE BRONCHOSCOPY;  Surgeon: [name], MD;  Location: [facility] ORS;  Service: Pulmonary;  Laterality: N/A;  . TONSILLECTOMY      There were no vitals filed for this visit.      Subjective Assessment - 07/20/17 0937    Subjective Pt states he feels good overall. Denies any pain but states that "it feels more like sleepiness" or "half speed" on the L side.    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 He was there 24 hours. Then he stayed at his daughter for 3 weeks. He went to out patient therapy 1 time and it was decided that her should wait unitl he flew back home to have the therapy. He got home from [place] stated spet 5th.    Limitations Standing;Walking   How long can you sit comfortably? unlimited   How long can  you stand comfortably? he gets tired but at least 30 mins   How long can you walk comfortably? intermediate distances with spc   Patient Stated Goals Patient wants to be able to walk long distances, have more energy, stay awake.   Currently in Pain? No/denies      Treatment: NuStep x 5 min (warm up)  Toe Taps to 6 in step w/o UE support x 20 - demonstration and cues for technique   Diagonal card taps standing on foam pad 2 x 20 - verbal cues for technique   Standing hip ABD w/ GTB around ankles x 20 B LE's - cues for slow eccentric return and to control movement   Standing hip ext w/GTB around ankles x 20 B LE's - cues for technique   3 cone taps w/CGA for safety x 10 ea. LE w/o UE support to address single leg balance and stability in order to improve gait and ADL's  Resisted walking w/7.5 lbs backward and side-to-side x 5 each direction - cues for control on return  Mini squats to mat table w/o use of UE's 2 x 10 reps - cues for technique and proper form during squat                           PT Education - 07/20/17 0938    Education  provided Yes   Education Details HEP progression    Person(s) Educated Patient   Methods Explanation   Comprehension Verbalized understanding          PT Short Term Goals - 07/16/17 1543      PT SHORT TERM GOAL #1   Title Patient will reduce timed up and go to <13 seconds to reduce fall risk and demonstrate improved transfer/gait ability.   Baseline 15.88 sec   Time 6   Period Weeks   Status New   Target Date 08/27/17     PT SHORT TERM GOAL #2   Title Patient (> 57 years old) will complete five times sit to stand test in < 18 seconds indicating an increased LE strength and improved balance.   Baseline 20.39 sec   Time 6   Period Weeks   Status New   Target Date 08/27/17     PT SHORT TERM GOAL #3   Title Patient will ambulate without spc indoors to be able to ambulate household distances and carry items.     Baseline Patient ambulates wiht spc   Time 6   Period Weeks   Status New   Target Date 08/27/17           PT Long Term Goals - 07/16/17 1532      PT LONG TERM GOAL #1   Title Patient will be independent in home exercise program to improve strength/mobility for better functional independence with ADLs.   Time 12   Period Weeks   Status New   Target Date 10/08/17     PT LONG TERM GOAL #2   Title Patient (> 24 years old) will complete five times sit to stand test in < 15 seconds indicating an increased LE strength and improved balance.   Baseline 20.39 sec   Time 12   Period Weeks   Status New   Target Date 10/08/17     PT LONG TERM GOAL #3   Title Patient will reduce timed up and go to <11 seconds to reduce fall risk and demonstrate improved transfer/gait ability.   Baseline 15.88 sec   Time 12   Period Weeks   Status New   Target Date 10/08/17     PT LONG TERM GOAL #4   Title Patient will ambulate wihtout AD for outside distances and uneven surfaces to be able to play golf.    Baseline Patient uses spc and ambulates intermediate distances.   Time 12   Period Weeks   Status New   Target Date 10/08/17               Plan - 07/20/17 1014    Clinical Impression Statement Patient was able to progress dynamic balance exercises today, requiring rest breaks secondary to decreased endurance.  Pt progress through newly added strength exercises today w/o increased pain and w/decreased cueing needed to complete tasks.  Coordination exercises were also incorporated into balance exercises in order to continue progressing dynamic balance tasks w/o loss of balance or falls.  Patient will continue to benefit from skilled PT services to address balance, gait, strength and mobility impairments in order to reduce risk for falls and improve funtional activity tolerance.   Rehab Potential Good   PT Frequency 2x / week   PT Duration 12 weeks   PT Treatment/Interventions Aquatic  Therapy;Cryotherapy;Moist Heat;Gait training;Stair training;Functional mobility training;Neuromuscular re-education;Balance training;Therapeutic exercise;Therapeutic activities;Patient/family education;Manual techniques   PT Next Visit Plan balance training, therapeutic exercise   PT  Home Exercise Plan standing hip ext and abd w/GTB   Consulted and Agree with Plan of Care Patient;Family member/caregiver      Patient will benefit from skilled therapeutic intervention in order to improve the following deficits and impairments:  Decreased balance, Decreased endurance, Decreased mobility, Difficulty walking, Dizziness, Decreased activity tolerance, Decreased strength, Abnormal gait  Visit Diagnosis: Muscle weakness (generalized)  Difficulty in walking, not elsewhere classified     Problem List Patient Active Problem List   Diagnosis Date Noted  . Special screening for malignant neoplasms, colon 07/02/2016   This entire session was performed under direct supervision and direction of a licensed therapist/therapist assistant . I have personally read, edited and approve of the note as written. Netta Corrigan, SPT Alanson Puls, PT, DPT  07/20/2017, 11:31 AM  [place] MAIN [facility] SERVICES [address] [place], [place], [address] Phone: [phone]   Fax:  [phone]  Name: Tyrone Fox MRN: [id number] Date of Birth: [birth year]

## 2017-07-22 ENCOUNTER — Encounter: Payer: Self-pay | Admitting: Physical Therapy

## 2017-07-22 ENCOUNTER — Ambulatory Visit: Payer: Medicare Other | Admitting: Occupational Therapy

## 2017-07-22 ENCOUNTER — Encounter: Payer: Self-pay | Admitting: Occupational Therapy

## 2017-07-22 ENCOUNTER — Ambulatory Visit: Payer: Medicare Other | Admitting: Physical Therapy

## 2017-07-22 DIAGNOSIS — M6281 Muscle weakness (generalized): Secondary | ICD-10-CM

## 2017-07-22 DIAGNOSIS — R262 Difficulty in walking, not elsewhere classified: Secondary | ICD-10-CM

## 2017-07-22 DIAGNOSIS — R278 Other lack of coordination: Secondary | ICD-10-CM

## 2017-07-22 NOTE — Therapy (Signed)
[place] MAIN [facility] SERVICES [address] [place], [place], [address] Phone: [phone]   Fax:  [phone]  Physical Therapy Treatment  Patient Details  Name: Tyrone Fox MRN: [id number] Date of Birth: 1946/05/08 Referring Provider: [name]  Encounter Date: 07/22/2017      PT End of Session - 07/22/17 1027    Visit Number 3   Number of Visits 25   Date for PT Re-Evaluation 10/08/17   Authorization Type 3/10 g codes   PT Start Time 1025   PT Stop Time 1110   PT Time Calculation (min) 45 min   Equipment Utilized During Treatment Gait belt   Activity Tolerance Patient tolerated treatment well;Patient limited by fatigue   Behavior During Therapy [facility] for tasks assessed/performed      History reviewed. No pertinent past medical history.  Past Surgical History:  Procedure Laterality Date  . CHOLECYSTECTOMY    . COLONOSCOPY N/A 03/04/2017   Procedure: COLONOSCOPY;  Surgeon: Rogene Houston, MD;  [facility];  Service: Endoscopy;  Laterality: N/A;  830  . FLEXIBLE BRONCHOSCOPY N/A 02/05/2017   Procedure: FLEXIBLE BRONCHOSCOPY;  Surgeon: [name], MD;  Location: [facility] ORS;  Service: Pulmonary;  Laterality: N/A;  . TONSILLECTOMY      There were no vitals filed for this visit.      Subjective Assessment - 07/22/17 1026    Subjective Pt states that he feels tired today, states that he did not sleep well last night due to changes of medication timing.  Pt denies any pain and states that he has been working out at the gym on days he is not in therapy.   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 He was there 24 hours. Then he stayed at his daughter for 3 weeks. He went to out patient therapy 1 time and it was decided that her should wait unitl he flew back home to have the therapy. He got home from [place] stated spet 5th.     Limitations Standing;Walking   How long can you sit comfortably? unlimited   How long can you stand comfortably? he gets tired but at least 30 mins   How long can you walk comfortably? intermediate distances with spc   Patient Stated Goals Patient wants to be able to walk long distances, have more energy, stay awake.   Currently in Pain? No/denies      Treatment:  NuStep warmup x 5 min  Resisted walking w/7.5 # resistance - fwd, and sideways x 5 reps each  Standing hip ABD w/green theraband x 20 B LE's   Standing hip ext w/green theraband x 20 B LE's   Toe taps to 6 in step x 20 B LE's   Side stepping over half foam standing on foam pads x 15 each way  Ladder side-stepping progression x 2 laps w/CGA for safety - cues for technique and pattern during exercise   Ladder Forward and side-stepping progression x 2 laps w/CGA for safety - cues for pattern and form  Walking with head turns x 2 laps in hallway w/CGA for safety - minor LOB, but able to self correct x 3 during exercise  Standing heel raises w/o UE support x 20 reps (added to HEP standing near counter at home to help improve balance and PF strength)  Fwd and backward stepping over 1/2 foam roll x  15 - cues for technique                             PT Education - 07/22/17 1027    Education provided Yes   Education Details Continue to do HEP daily   Person(s) Educated Patient   Methods Explanation   Comprehension Verbalized understanding          PT Short Term Goals - 07/16/17 1543      PT SHORT TERM GOAL #1   Title Patient will reduce timed up and go to <13 seconds to reduce fall risk and demonstrate improved transfer/gait ability.   Baseline 15.88 sec   Time 6   Period Weeks   Status New   Target Date 08/27/17     PT SHORT TERM GOAL #2   Title Patient (> 72 years old) will complete five times sit to stand test in < 18 seconds indicating an increased LE strength and improved balance.    Baseline 20.39 sec   Time 6   Period Weeks   Status New   Target Date 08/27/17     PT SHORT TERM GOAL #3   Title Patient will ambulate without spc indoors to be able to ambulate household distances and carry items.    Baseline Patient ambulates wiht spc   Time 6   Period Weeks   Status New   Target Date 08/27/17           PT Long Term Goals - 07/16/17 1532      PT LONG TERM GOAL #1   Title Patient will be independent in home exercise program to improve strength/mobility for better functional independence with ADLs.   Time 12   Period Weeks   Status New   Target Date 10/08/17     PT LONG TERM GOAL #2   Title Patient (> 72 years old) will complete five times sit to stand test in < 15 seconds indicating an increased LE strength and improved balance.   Baseline 20.39 sec   Time 12   Period Weeks   Status New   Target Date 10/08/17     PT LONG TERM GOAL #3   Title Patient will reduce timed up and go to <11 seconds to reduce fall risk and demonstrate improved transfer/gait ability.   Baseline 15.88 sec   Time 12   Period Weeks   Status New   Target Date 10/08/17     PT LONG TERM GOAL #4   Title Patient will ambulate wihtout AD for outside distances and uneven surfaces to be able to play golf.    Baseline Patient uses spc and ambulates intermediate distances.   Time 12   Period Weeks   Status New   Target Date 10/08/17               Plan - 07/22/17 1111    Clinical Impression Statement Dynamic balance exercsies were progressed today, with patient showing improvement and decreased level of guarding needed during all activities.  Pt had mild LOB with dynamic balance activities on the agility ladder and with ambulation with head turns, but was able to self correct without assistance.  Pt was also able to progress strengthening exercises today without increase of pain or fatigue. Pt required less cues and rest breaks during today's session.  Pt would continue to  benefit from skilled therapy services in order to address dynamic balance deficits and decrease overall  risk for falls.   Rehab Potential Good   PT Frequency 2x / week   PT Duration 12 weeks   PT Treatment/Interventions Aquatic Therapy;Cryotherapy;Moist Heat;Gait training;Stair training;Functional mobility training;Neuromuscular re-education;Balance training;Therapeutic exercise;Therapeutic activities;Patient/family education;Manual techniques   PT Next Visit Plan balance training, therapeutic exercise   PT Home Exercise Plan standing hip ext and abd w/GTB   Consulted and Agree with Plan of Care Patient;Family member/caregiver      Patient will benefit from skilled therapeutic intervention in order to improve the following deficits and impairments:  Decreased balance, Decreased endurance, Decreased mobility, Difficulty walking, Dizziness, Decreased activity tolerance, Decreased strength, Abnormal gait  Visit Diagnosis: Muscle weakness (generalized)  Difficulty in walking, not elsewhere classified     Problem List Patient Active Problem List   Diagnosis Date Noted  . Special screening for malignant neoplasms, colon 07/02/2016   This entire session was performed under direct supervision and direction of a licensed therapist/therapist assistant . I have personally read, edited and approve of the note as written. Netta Corrigan, SPT Alanson Puls, PT, DPT 07/22/2017, 12:12 PM  [place] MAIN [facility] SERVICES [address] [place], [place], [address] Phone: [phone]   Fax:  [phone]  Name: Tyrone Fox MRN: [id number] Date of Birth: 11-20-1945

## 2017-07-22 NOTE — Therapy (Signed)
[place] MAIN [facility] SERVICES [address] [place], [place], [address] Phone: [phone]   Fax:  [phone]  Occupational Therapy Evaluation  Patient Details  Name: Tyrone Fox MRN: [id number] Date of Birth: 1946/10/18 Referring Provider: Dr. Kary Kos  Encounter Date: 07/22/2017      OT End of Session - 07/22/17 1143    Visit Number 1   Number of Visits 1   Date for OT Re-Evaluation 07/22/17   Authorization Type Medicare G-Code 1   OT Start Time 0930   OT Stop Time 1015   OT Time Calculation (min) 45 min   Activity Tolerance Patient tolerated treatment well   Behavior During Therapy [facility] for tasks assessed/performed      History reviewed. No pertinent past medical history.  Past Surgical History:  Procedure Laterality Date  . CHOLECYSTECTOMY    . COLONOSCOPY N/A 03/04/2017   Procedure: COLONOSCOPY;  Surgeon: Rogene Houston, MD;  [facility];  Service: Endoscopy;  Laterality: N/A;  830  . FLEXIBLE BRONCHOSCOPY N/A 02/05/2017   Procedure: FLEXIBLE BRONCHOSCOPY;  Surgeon: [name], MD;  Location: [facility] ORS;  Service: Pulmonary;  Laterality: N/A;  . TONSILLECTOMY      There were no vitals filed for this visit.      Subjective Assessment - 07/22/17 1136    Subjective  Pt. reports he is able to do all his self-care. Pt. reports he has had some depression.   Patient is accompained by: Family member   Pertinent History Pt. is a 71 y.o. male who was admitted to [facility] on 06/18/2017 following a CVA. Pt. was at [facility] for 24 hours. Pt. was discharged, and flew to [facility]. Pt. was admitted to the Hospital in [place], and had a thorough workup. Pt. was in [place] for 3 weeks, and had one outpatient therapy session while in [place]. Pt. has retruned to [facility], and is now ready to continue outpatient OT services.   Patient Stated Goals To get back to normal.   Currently in Pain? No/denies            [facility] OT Assessment - 07/22/17 0001      Assessment   Diagnosis CVA   Referring Provider Dr. Kary Kos   Onset Date 06/18/17     Balance Screen   Has the patient fallen in the past 6 months Yes   How many times? 1   Has the patient had a decrease in activity level because of a fear of falling?  No   Is the patient reluctant to leave their home because of a fear of falling?  No     Home  Environment   Family/patient expects to be discharged to: Private residence   Living Arrangements Spouse/significant other   Available Help at Discharge Family   Type of [place] One level   Bathroom Building control surveyor;Door   English as a second language teacher - single point   Lives With Spouse     Prior Function   Level of Independence Independent   Vocation Retired   [employer]   Leisure watch sports, played golf     ADL   Eating/Feeding Independent   Grooming Independent   Multimedia programmer -  Comptroller Independent   ADL comments MAM-20 sum score: 80, typing speed 24 wpm.     IADL   Shopping Shops independently for small purchases   Light Housekeeping Performs light daily tasks such as dishwashing, bed making   Meal Prep Able to complete simple warm meal prep   Medication Management Is responsible for taking medication in correct dosages at correct time     Written Expression   Dominant Hand Right   Handwriting 100% legible     Vision - History   Baseline Vision Wears glasses only for reading   Patient Visual Report --  Cataracts removed one year ago.     Activity Tolerance   Activity Tolerance Tolerates 10-20 min activity with  muiltiple rests     Cognition   Overall Cognitive Status Within Functional Limits for tasks assessed     Coordination   Right 9 Hole Peg Test 25   Left 9 Hole Peg Test 31     Strength   Overall Strength Comments BUE strength: 5/5 overall bilateral shoulder flexion, abduction, elbow flexion, extension, forearm supination, wrist extension.     Hand Function   Right Hand Grip (lbs) 62   Right Hand Lateral Pinch 20 lbs   Right Hand 3 Point Pinch 22 lbs   Left Hand Grip (lbs) 62   Left Hand Lateral Pinch 20 lbs   Left 3 point pinch 20 lbs                         OT Education - 07/30/2017 1136    Education provided Yes   Person(s) Educated Patient   Methods Explanation   Comprehension Verbalized understanding                    Plan - 07-30-17 1144    Clinical Impression Statement Pt. is a 71 y.o. 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 Pt. LUE deficits are resolving. Pt. has improved overall LUE strength, and coordination skills. Pt. is able to use his LUE to complete daily ADL. and IADL tasks. Pt. scores an 80/80 sum score on the MAM-20. No further OT services are warranted at this time. OT services are discharged. Pt. is in agreement.   Occupational performance deficits (Please refer to evaluation for details): ADL's;IADL's   Consulted and Agree with Plan of Care Patient      Patient will benefit from skilled therapeutic intervention in order to improve the following deficits and impairments:  Decreased range of motion, Decreased coordination  Visit Diagnosis: Other lack of coordination  Muscle weakness (generalized)      G-Codes - 2017-07-30 1158    Functional Assessment Tool Used (Outpatient only) Clinical judgement based on pt. current functional level, MAM-20, 9 hole peg test   Functional Limitation Self care;Carrying, moving and handling objects   Carrying, Moving and Handling Objects Current Status (D6644) 0 percent  impaired, limited or restricted   Carrying, Moving and Handling Objects Goal Status (I3474) 0 percent impaired, limited or restricted   Self Care Current Status (Q5956) 0 percent impaired, limited or restricted   Self Care Goal Status (L8756) 0 percent impaired, limited or restricted   Self Care Discharge Status (E3329) 0 percent impaired, limited or restricted      Problem List Patient Active Problem List   Diagnosis Date Noted  . Special screening for malignant neoplasms,  colon 07/02/2016    Harrel Carina, MS, OTR/L 07/22/2017, 12:00 PM  [place] MAIN [facility] SERVICES [address] [place], [place], [address] Phone: [phone]   Fax:  [phone]  Name: Tyrone Fox MRN: [id number] Date of Birth: 1946-05-22

## 2017-07-28 ENCOUNTER — Encounter: Payer: Self-pay | Admitting: Physical Therapy

## 2017-07-28 ENCOUNTER — Ambulatory Visit: Payer: Medicare Other | Admitting: Occupational Therapy

## 2017-07-28 ENCOUNTER — Ambulatory Visit: Payer: Medicare Other | Admitting: Physical Therapy

## 2017-07-28 DIAGNOSIS — R278 Other lack of coordination: Secondary | ICD-10-CM

## 2017-07-28 DIAGNOSIS — R262 Difficulty in walking, not elsewhere classified: Secondary | ICD-10-CM

## 2017-07-28 DIAGNOSIS — M6281 Muscle weakness (generalized): Secondary | ICD-10-CM

## 2017-07-28 NOTE — Therapy (Signed)
[place] MAIN [facility] SERVICES [address] [place], [place], [address] Phone: [phone]   Fax:  [phone]  Physical Therapy Treatment  Patient Details  Name: Tyrone Fox MRN: [id number] Date of Birth: Jan 07, 1946 Referring Provider: [name]  Encounter Date: 07/28/2017      PT End of Session - 07/28/17 1624    Visit Number 4   Number of Visits 25   Date for PT Re-Evaluation 10/08/17   Authorization Type 4/10 g codes   PT Start Time 0420   PT Stop Time 0500   PT Time Calculation (min) 40 min   Equipment Utilized During Treatment Gait belt   Activity Tolerance Patient tolerated treatment well;Patient limited by fatigue   Behavior During Therapy [facility] for tasks assessed/performed      History reviewed. No pertinent past medical history.  Past Surgical History:  Procedure Laterality Date  . CHOLECYSTECTOMY    . COLONOSCOPY N/A 03/04/2017   Procedure: COLONOSCOPY;  Surgeon: Rogene Houston, MD;  [facility];  Service: Endoscopy;  Laterality: N/A;  830  . FLEXIBLE BRONCHOSCOPY N/A 02/05/2017   Procedure: FLEXIBLE BRONCHOSCOPY;  Surgeon: [name], MD;  Location: [facility] ORS;  Service: Pulmonary;  Laterality: N/A;  . TONSILLECTOMY      There were no vitals filed for this visit.      Subjective Assessment - 07/28/17 1621    Subjective Pt states that overall he is feeling good, notes mild soreness in his neck.  Pt states that he is continuing to exercise at the gym at least 5 days per week.  Notes continued dizziness at times and states that he feels his balance is getting better.  He states that he stil has feelings of unsteadiness when walking.   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 He was there 24 hours. Then he stayed at his daughter for 3 weeks. He went to out patient therapy 1  time and it was decided that her should wait unitl he flew back home to have the therapy. He got home from [place] stated spet 5th.    Limitations Standing;Walking   How long can you sit comfortably? unlimited   How long can you stand comfortably? he gets tired but at least 30 mins   How long can you walk comfortably? intermediate distances with spc   Patient Stated Goals Patient wants to be able to walk long distances, have more energy, stay awake.     Treatment: NuStep x 5 min warmup  Heel Raises x 20 reps  Lateral walks on foam beam x 5 laps in // bars  Core twists with red weighted ball standing on foam beam x 20  Diagonal chops standing on foam beam x 20 each way  Toe taps to 6 in step standing on foam pad x 20  Step ups to 6 in step standing on foam pad x 20  Lateral steps over 1/2 foam from foam pads x 20  Ball toss standing on foam pad x 30 tosses  Ball toss standing on foam pad in narrow stance x 30 tosses  Resisted gait with 12.5 lbs - 5 reps side to side and backward walking  4 square stepping -diagonal and fwd/back x 5 reps each  Star single leg balance tapping - 3 reps through cycle - decreased single limb control  R>L  Pt required cueing during exercises for proper form and technique.  Decreased stability noted when performing single leg tasks.                                    PT Short Term Goals - 07/16/17 1543      PT SHORT TERM GOAL #1   Title Patient will reduce timed up and go to <13 seconds to reduce fall risk and demonstrate improved transfer/gait ability.   Baseline 15.88 sec   Time 6   Period Weeks   Status New   Target Date 08/27/17     PT SHORT TERM GOAL #2   Title Patient (> 72 years old) will complete five times sit to stand test in < 18 seconds indicating an increased LE strength and improved balance.   Baseline 20.39 sec   Time 6   Period Weeks   Status New   Target Date 08/27/17     PT SHORT TERM  GOAL #3   Title Patient will ambulate without spc indoors to be able to ambulate household distances and carry items.    Baseline Patient ambulates wiht spc   Time 6   Period Weeks   Status New   Target Date 08/27/17           PT Long Term Goals - 07/16/17 1532      PT LONG TERM GOAL #1   Title Patient will be independent in home exercise program to improve strength/mobility for better functional independence with ADLs.   Time 12   Period Weeks   Status New   Target Date 10/08/17     PT LONG TERM GOAL #2   Title Patient (> 72 years old) will complete five times sit to stand test in < 15 seconds indicating an increased LE strength and improved balance.   Baseline 20.39 sec   Time 12   Period Weeks   Status New   Target Date 10/08/17     PT LONG TERM GOAL #3   Title Patient will reduce timed up and go to <11 seconds to reduce fall risk and demonstrate improved transfer/gait ability.   Baseline 15.88 sec   Time 12   Period Weeks   Status New   Target Date 10/08/17     PT LONG TERM GOAL #4   Title Patient will ambulate wihtout AD for outside distances and uneven surfaces to be able to play golf.    Baseline Patient uses spc and ambulates intermediate distances.   Time 12   Period Weeks   Status New   Target Date 10/08/17               Plan - 07/28/17 1705    Clinical Impression Statement Pt was able to progress dynamic balance exercises today, noting improved postural reactions with LOB and moving outside normal BOS.  Pt was able to perform all exercises on uneven surfaces with minimal LOB noted.  Single limb stability activities were added today, with decrease control noted when atempting to perform tasks with the R > L.  Pt would continue to benefit from skilled therapy services to address further balance impairments and decrease falls risk.   Rehab Potential Good   PT Frequency 2x / week   PT Duration 12 weeks   PT Treatment/Interventions Aquatic  Therapy;Cryotherapy;Moist Heat;Gait training;Stair training;Functional mobility training;Neuromuscular re-education;Balance training;Therapeutic exercise;Therapeutic activities;Patient/family education;Manual techniques  PT Next Visit Plan dynamic balance training - focus on single limb stability and varying BOS   PT Home Exercise Plan standing hip ext and abd w/GTB   Consulted and Agree with Plan of Care Patient;Family member/caregiver      Patient will benefit from skilled therapeutic intervention in order to improve the following deficits and impairments:  Decreased balance, Decreased endurance, Decreased mobility, Difficulty walking, Dizziness, Decreased activity tolerance, Decreased strength, Abnormal gait  Visit Diagnosis: Muscle weakness (generalized)  Difficulty in walking, not elsewhere classified  Other lack of coordination     Problem List Patient Active Problem List   Diagnosis Date Noted  . Special screening for malignant neoplasms, colon 07/02/2016   This entire session was performed under direct supervision and direction of a licensed therapist/therapist assistant . I have personally read, edited and approve of the note as written. Netta Corrigan, SPT Alanson Puls, PT, DPT  07/28/2017, 5:08 PM  [place] MAIN [facility] SERVICES [address] [place], [place], [address] Phone: [phone]   Fax:  [phone]  Name: CASHEL BELLINA MRN: [id number] Date of Birth: [birth year]

## 2017-07-30 ENCOUNTER — Ambulatory Visit: Payer: Medicare Other | Admitting: Occupational Therapy

## 2017-07-30 ENCOUNTER — Ambulatory Visit: Payer: Medicare Other

## 2017-07-30 DIAGNOSIS — M6281 Muscle weakness (generalized): Secondary | ICD-10-CM | POA: Diagnosis not present

## 2017-07-30 DIAGNOSIS — R278 Other lack of coordination: Secondary | ICD-10-CM

## 2017-07-30 DIAGNOSIS — R262 Difficulty in walking, not elsewhere classified: Secondary | ICD-10-CM

## 2017-07-30 NOTE — Therapy (Signed)
[place] MAIN [facility] SERVICES [address] [place], [place], [address] Phone: [phone]   Fax:  [phone]  Physical Therapy Treatment  Patient Details  Name: Tyrone Fox MRN: [id number] Date of Birth: 1946-01-18 Referring Provider: [name]  Encounter Date: 07/30/2017      PT End of Session - 07/30/17 12/15/1201    Visit Number 5   Number of Visits 25   Date for PT Re-Evaluation 10/08/17   Authorization Type 5/10 g codes   PT Start Time 12/15/13   PT Stop Time 1200   PT Time Calculation (min) 45 min   Equipment Utilized During Treatment Gait belt   Activity Tolerance Patient tolerated treatment well;Patient limited by fatigue   Behavior During Therapy [facility] for tasks assessed/performed      No past medical history on file.  Past Surgical History:  Procedure Laterality Date  . CHOLECYSTECTOMY    . COLONOSCOPY N/A 03/04/2017   Procedure: COLONOSCOPY;  Surgeon: Rogene Houston, MD;  [facility];  Service: Endoscopy;  Laterality: N/A;  830  . FLEXIBLE BRONCHOSCOPY N/A 02/05/2017   Procedure: FLEXIBLE BRONCHOSCOPY;  Surgeon: [name], MD;  Location: [facility] ORS;  Service: Pulmonary;  Laterality: N/A;  . TONSILLECTOMY      There were no vitals filed for this visit.      Subjective Assessment - 07/30/17 1201    Subjective Patient has increased frequency of going to the gym, disappointment  in limited ability to perform elliptical. 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 He was there 24 hours. Then he stayed at his daughter for 3 weeks. He went to out patient therapy 1 time and it was decided that her should wait unitl he flew back home to have the therapy. He got home from [place] stated spet 5th.    Limitations Standing;Walking   How long can you sit comfortably?  unlimited   How long can you stand comfortably? he gets tired but at least 30 mins   How long can you walk comfortably? intermediate distances with spc   Patient Stated Goals Patient wants to be able to walk long distances, have more energy, stay awake.   Currently in Pain? No/denies      Treatment:  Neuro Re-ed SLS with single UE support 2x   Airex pad 6" step up 20x   Airex pad 6" side step up 20x x 2  Airex pad: static stance 3 minutes  Diagonal chops with weighted ball 6lb; standing on foamx 20  Airex pad; 6lb weighted ball overhead raises 15x   Lateral steps over 1/2 foam from foam pads x 20  Heel Raises x 20 reps   Lateral walks on foam beam x 5 laps in // bars   Core twists with red weighted ball standing on foam beam x 20     Airex pad: 3 cones : frequent knocking of cones over and LOB:   Airex pad; balloon toss with narrow stance x 40 tosses    Step over and back over orange hurdle 15x each leg, occasional SUE support required.   Sit to stand from plinth with 5lb bar raises 12x   Sit to stand from plinth with Airex pad under feet with 5lb bar raises 12x   Modified single limb support stance with opp  LE on basketball 2x2 minutes each leg.      Pt required cueing during exercises for proper form and technique.  Decreased stability noted when performing single leg tasks.                          PT Education - 07/30/17 1202    Education provided Yes   Education Details [employer]) Educated Patient   Methods Explanation;Demonstration;Verbal cues   Comprehension Verbalized understanding;Returned demonstration          PT Short Term Goals - 07/16/17 1543      PT SHORT TERM GOAL #1   Title Patient will reduce timed up and go to <13 seconds to reduce fall risk and demonstrate improved transfer/gait ability.   Baseline 15.88 sec   Time 6   Period Weeks   Status New   Target Date 08/27/17     PT SHORT TERM  GOAL #2   Title Patient (> 72 years old) will complete five times sit to stand test in < 18 seconds indicating an increased LE strength and improved balance.   Baseline 20.39 sec   Time 6   Period Weeks   Status New   Target Date 08/27/17     PT SHORT TERM GOAL #3   Title Patient will ambulate without spc indoors to be able to ambulate household distances and carry items.    Baseline Patient ambulates wiht spc   Time 6   Period Weeks   Status New   Target Date 08/27/17           PT Long Term Goals - 07/16/17 1532      PT LONG TERM GOAL #1   Title Patient will be independent in home exercise program to improve strength/mobility for better functional independence with ADLs.   Time 12   Period Weeks   Status New   Target Date 10/08/17     PT LONG TERM GOAL #2   Title Patient (> 72 years old) will complete five times sit to stand test in < 15 seconds indicating an increased LE strength and improved balance.   Baseline 20.39 sec   Time 12   Period Weeks   Status New   Target Date 10/08/17     PT LONG TERM GOAL #3   Title Patient will reduce timed up and go to <11 seconds to reduce fall risk and demonstrate improved transfer/gait ability.   Baseline 15.88 sec   Time 12   Period Weeks   Status New   Target Date 10/08/17     PT LONG TERM GOAL #4   Title Patient will ambulate wihtout AD for outside distances and uneven surfaces to be able to play golf.    Baseline Patient uses spc and ambulates intermediate distances.   Time 12   Period Weeks   Status New   Target Date 10/08/17               Plan - 07/30/17 1205    Clinical Impression Statement Patient progressing with dynamic balance and ankle stability to retain COM when performing challenging tasks. Frequent rest breaks were required due to fatigue. Core stability lacking and pt. Required cueing for upright posture. Patient will continue to benefit from skilled physical therapy services to address further  balance impairments and decrease fall risk.    Rehab Potential Good   PT Frequency 2x / week   PT Duration  12 weeks   PT Treatment/Interventions Aquatic Therapy;Cryotherapy;Moist Heat;Gait training;Stair training;Functional mobility training;Neuromuscular re-education;Balance training;Therapeutic exercise;Therapeutic activities;Patient/family education;Manual techniques   PT Next Visit Plan dynamic balance training - focus on single limb stability and varying BOS   PT Home Exercise Plan standing hip ext and abd w/GTB   Consulted and Agree with Plan of Care Patient;Family member/caregiver      Patient will benefit from skilled therapeutic intervention in order to improve the following deficits and impairments:  Decreased balance, Decreased endurance, Decreased mobility, Difficulty walking, Dizziness, Decreased activity tolerance, Decreased strength, Abnormal gait  Visit Diagnosis: Muscle weakness (generalized)  Difficulty in walking, not elsewhere classified  Other lack of coordination     Problem List Patient Active Problem List   Diagnosis Date Noted  . Special screening for malignant neoplasms, colon 07/02/2016   Janna Arch, PT, DPT   Janna Arch 07/30/2017, 12:05 PM  [place] MAIN [facility] SERVICES [address] [place], [place], [address] Phone: [phone]   Fax:  [phone]  Name: Tyrone Fox MRN: [id number] Date of Birth: September 15, 1946

## 2017-08-03 ENCOUNTER — Encounter: Payer: Self-pay | Admitting: Physical Therapy

## 2017-08-03 ENCOUNTER — Ambulatory Visit: Payer: Medicare Other | Attending: Family Medicine | Admitting: Physical Therapy

## 2017-08-03 DIAGNOSIS — R41841 Cognitive communication deficit: Secondary | ICD-10-CM | POA: Insufficient documentation

## 2017-08-03 DIAGNOSIS — R262 Difficulty in walking, not elsewhere classified: Secondary | ICD-10-CM

## 2017-08-03 DIAGNOSIS — R278 Other lack of coordination: Secondary | ICD-10-CM | POA: Insufficient documentation

## 2017-08-03 DIAGNOSIS — M6281 Muscle weakness (generalized): Secondary | ICD-10-CM | POA: Diagnosis not present

## 2017-08-03 NOTE — Therapy (Signed)
[place] MAIN [facility] SERVICES [address] [place], [place], [address] Phone: [phone]   Fax:  [phone]  Physical Therapy Treatment  Patient Details  Name: Tyrone Fox MRN: [id number] Date of Birth: [birth year] Referring Provider: [name]  Encounter Date: 08/03/2017      PT End of Session - 08/03/17 0932    Visit Number 6   Number of Visits 25   Date for PT Re-Evaluation 10/08/17   Authorization Type 6/10 g codes   PT Start Time 0930   PT Stop Time 1015   PT Time Calculation (min) 45 min   Equipment Utilized During Treatment Gait belt   Activity Tolerance Patient tolerated treatment well;Patient limited by fatigue   Behavior During Therapy [facility] for tasks assessed/performed      History reviewed. No pertinent past medical history.  Past Surgical History:  Procedure Laterality Date  . CHOLECYSTECTOMY    . COLONOSCOPY N/A 03/04/2017   Procedure: COLONOSCOPY;  Surgeon: Rogene Houston, MD;  [facility];  Service: Endoscopy;  Laterality: N/A;  830  . FLEXIBLE BRONCHOSCOPY N/A 02/05/2017   Procedure: FLEXIBLE BRONCHOSCOPY;  Surgeon: [name], MD;  Location: [facility] ORS;  Service: Pulmonary;  Laterality: N/A;  . TONSILLECTOMY      There were no vitals filed for this visit.      Subjective Assessment - 08/03/17 0931    Subjective Patient reports being dizzy and unsteady over the weekend. He reports that he took a sleeping pill and thinks that's what did it. Denies any new falls. reports otherwise feeling pretty good this morning    Patient is accompained by: Family member   Pertinent History Pat[place] had CVA 06/18/17, he traveled the next day on the airplane and he went to the hospital the next day. He was there 24 hours. Then he stayed at his daughter for 3 weeks. He went to out patient therapy 1 time and it was decided that her should wait unitl he flew back home to have the therapy. He got home from  [place] stated spet 5th.    Limitations Standing;Walking   How long can you sit comfortably? unlimited   How long can you stand comfortably? he gets tired but at least 30 mins   How long can you walk comfortably? intermediate distances with spc   Patient Stated Goals Patient wants to be able to walk long distances, have more energy, stay awake.   Currently in Pain? No/denies        Treatment: Warm up on Nustep BUE/BLE level 2 x5 min (unbilled);  Neuro Re-ed Standing on airex x2: Feet together: eyes open/closed 10 sec hold x3 each with min A for balance; patient unsteady with eyes closed exhibiting right sided lean requiring min Vcs to improve weight shift; -Heel/toe raises x15 reps with cues to keep knees straight for better ankle ROM;  -Alternate march with high knee lift x10 bilaterally with 1-0 rail assist, min A for safety; -modified tandem stance with BUE ball overhead lift x10 reps each foot in front; -Modified tandem stance with BUE ball pass side/side x10 reps each foot in front; Patient required min VCs for balance stability, including to increase trunk control for less loss of balance with smaller base of support  Airex beam: Tandem gait x6 laps without rail assist with CGA for safety and cues to narrow base of support to increase balance challenge; Side stepping x4 laps each direction without rail assist, close supervision for  safety; Feet apart, balloon taps each UE x3-4 min with 2-3 episodes of loss of balance requiring rail assist to avoid fall; Patient requires cues to improve ankle strategies and weight shift for better balance control;   Standing on 1/2 bolster (flat side up): Heel/toe raises x15 with rail assist to facilitate better stretch in calf and ankle ROM; BUE wand flexion with feet balanced in middle (working on ankle strategies to keep stance control) 2x10; Requires cues to improve upper trunk control and slow down UE movement for less loss of balance;     Seated: Green tband ankle DF 2x10 bilaterally; Requires min Vcs for correct foot positioning to improve ankle strengthening;  Green tband hip flexion march x15 bilaterally with cues to keep support against back of chair for better hip strengthening;     Resisted walking 12.5# forward/backward, side/side 4 way (each direction) x2 laps each with min A for safety and cues to increase step length and slow down eccentric return for better balance control;  Patient tolerated session well; Denies any pain at end of session;                       PT Education - 08/03/17 0931    Education provided Yes   Education Details balance/HEP reinforced   Person(s) Educated Patient   Methods Explanation;Demonstration;Verbal cues   Comprehension Verbalized understanding;Returned demonstration;Verbal cues required;Need further instruction          PT Short Term Goals - 07/16/17 1543      PT SHORT TERM GOAL #1   Title Patient will reduce timed up and go to <13 seconds to reduce fall risk and demonstrate improved transfer/gait ability.   Baseline 15.88 sec   Time 6   Period Weeks   Status New   Target Date 08/27/17     PT SHORT TERM GOAL #2   Title Patient (> 68 years old) will complete five times sit to stand test in < 18 seconds indicating an increased LE strength and improved balance.   Baseline 20.39 sec   Time 6   Period Weeks   Status New   Target Date 08/27/17     PT SHORT TERM GOAL #3   Title Patient will ambulate without spc indoors to be able to ambulate household distances and carry items.    Baseline Patient ambulates wiht spc   Time 6   Period Weeks   Status New   Target Date 08/27/17           PT Long Term Goals - 07/16/17 1532      PT LONG TERM GOAL #1   Title Patient will be independent in home exercise program to improve strength/mobility for better functional independence with ADLs.   Time 12   Period Weeks   Status New   Target Date  10/08/17     PT LONG TERM GOAL #2   Title Patient (> 31 years old) will complete five times sit to stand test in < 15 seconds indicating an increased LE strength and improved balance.   Baseline 20.39 sec   Time 12   Period Weeks   Status New   Target Date 10/08/17     PT LONG TERM GOAL #3   Title Patient will reduce timed up and go to <11 seconds to reduce fall risk and demonstrate improved transfer/gait ability.   Baseline 15.88 sec   Time 12   Period Weeks   Status New  Target Date 10/08/17     PT LONG TERM GOAL #4   Title Patient will ambulate wihtout AD for outside distances and uneven surfaces to be able to play golf.    Baseline Patient uses spc and ambulates intermediate distances.   Time 12   Period Weeks   Status New   Target Date 10/08/17               Plan - 08/03/17 1002    Clinical Impression Statement Patient instructed in advanced balance exercise on uneven surface to facilitate better ankle/hip strategies and improved stance control. He demonstrates decreased balance with eyes closed on double airex indicating impaired vestibular input for balance. Patient requires cues to improve upper trunk control, weight shift and to slow down UE movement outside base of support to improve dynamic balance especially with narrow base of support. Patient instructed in advanced strengthening to improve strength with standing tasks. He would benefit from additional skilled PT intervention to improve strength, balance and gait safety;    Rehab Potential Good   PT Frequency 2x / week   PT Duration 12 weeks   PT Treatment/Interventions Aquatic Therapy;Cryotherapy;Moist Heat;Gait training;Stair training;Functional mobility training;Neuromuscular re-education;Balance training;Therapeutic exercise;Therapeutic activities;Patient/family education;Manual techniques   PT Next Visit Plan dynamic balance training - focus on single limb stability and varying BOS   PT Home Exercise Plan  continue as given;    Consulted and Agree with Plan of Care Patient;Family member/caregiver      Patient will benefit from skilled therapeutic intervention in order to improve the following deficits and impairments:  Decreased balance, Decreased endurance, Decreased mobility, Difficulty walking, Dizziness, Decreased activity tolerance, Decreased strength, Abnormal gait  Visit Diagnosis: Muscle weakness (generalized)  Difficulty in walking, not elsewhere classified     Problem List Patient Active Problem List   Diagnosis Date Noted  . Special screening for malignant neoplasms, colon 07/02/2016    Trotter,Margaret PT, DPT 08/03/2017, 10:04 AM  [place] MAIN [facility] SERVICES [address] [place], [place], [address] Phone: [phone]   Fax:  [phone]  Name: Tyrone Fox MRN: [id number] Date of Birth: 03-16-1946

## 2017-08-05 ENCOUNTER — Ambulatory Visit: Payer: Medicare Other | Admitting: Occupational Therapy

## 2017-08-05 ENCOUNTER — Ambulatory Visit: Payer: Medicare Other | Admitting: Speech Pathology

## 2017-08-05 DIAGNOSIS — R41841 Cognitive communication deficit: Secondary | ICD-10-CM

## 2017-08-05 DIAGNOSIS — M6281 Muscle weakness (generalized): Secondary | ICD-10-CM | POA: Diagnosis not present

## 2017-08-06 ENCOUNTER — Ambulatory Visit: Payer: Medicare Other

## 2017-08-06 ENCOUNTER — Encounter: Payer: Self-pay | Admitting: Speech Pathology

## 2017-08-06 VITALS — BP 105/73 | HR 69

## 2017-08-06 DIAGNOSIS — M6281 Muscle weakness (generalized): Secondary | ICD-10-CM | POA: Diagnosis not present

## 2017-08-06 DIAGNOSIS — R262 Difficulty in walking, not elsewhere classified: Secondary | ICD-10-CM

## 2017-08-06 NOTE — Therapy (Signed)
[place] MAIN [facility] SERVICES [address] [place], [place], [address] Phone: [phone]   Fax:  [phone]  Physical Therapy Treatment  Patient Details  Name: Tyrone Fox MRN: [id number] Date of Birth: September 14, 1946 Referring Provider: [name]  Encounter Date: 08/06/2017      PT End of Session - 08/06/17 0943    Visit Number 7   Number of Visits 25   Date for PT Re-Evaluation 10/08/17   Authorization Type 7/10 g codes   PT Start Time 0940   PT Stop Time 1020   PT Time Calculation (min) 40 min   Equipment Utilized During Treatment Gait belt   Activity Tolerance Patient tolerated treatment well;Patient limited by fatigue   Behavior During Therapy [facility] for tasks assessed/performed      History reviewed. No pertinent past medical history.  Past Surgical History:  Procedure Laterality Date  . CHOLECYSTECTOMY    . COLONOSCOPY N/A 03/04/2017   Procedure: COLONOSCOPY;  Surgeon: Rogene Houston, MD;  [facility];  Service: Endoscopy;  Laterality: N/A;  830  . FLEXIBLE BRONCHOSCOPY N/A 02/05/2017   Procedure: FLEXIBLE BRONCHOSCOPY;  Surgeon: [name], MD;  Location: [facility] ORS;  Service: Pulmonary;  Laterality: N/A;  . TONSILLECTOMY      Vitals:   08/06/17 0943  BP: 105/73  Pulse: 69  SpO2: 97%        Subjective Assessment - 08/06/17 0941    Subjective Pt reports he is doing well on this date. He started a new medication for his prostate which is helping him sleep longer without waking up to go to the bathroom. He does not remember the name of the medication. No specific questions or concerns at this time.    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 He was there 24 hours. Then he stayed at his daughter for 3 weeks. He went to out patient therapy 1 time and it was decided that her  should wait unitl he flew back home to have the therapy. He got home from [place] stated spet 5th.    Limitations Standing;Walking   How long can you sit comfortably? unlimited   How long can you stand comfortably? he gets tired but at least 30 mins   How long can you walk comfortably? intermediate distances with spc   Patient Stated Goals Patient wants to be able to walk long distances, have more energy, stay awake.   Currently in Pain? No/denies         TREATMENT  Ther-ex Warm up on Nustep BUE/BLE level 2 x 5 min during history;  Resisted walking 12.5# forward/backward, side/side 4 way (each direction) x2 laps each with min A for safety and cues to increase step length and slow down eccentric return for better balance control;  Sit to stand without UE support with 5# overhead bar press 2 x 10;  Seated green tband ankle eversion and then DF x 15 bilaterally; Requires min Vcs for correct foot positioning to improve ankle strengthening;   Green tband hip flexion march x 15 bilaterally;   Neuromuscular Re-education Tandem progressions: Semitandem progressing to full tandem balance alternating forward LE with horizontal and then vertical head turns x multiple 30s bouts in each position;  Airex beam: Tandem gait x 6 laps without rail assist with CGA for safety and cues to  narrow base of support to increase balance challenge; Side stepping x 4 laps each direction without rail assist, close supervision for safety;  Static standing balance on 1/2 bolster (flat side up):  Standing on 1/2 bolster (flat side up) heel/toe raises with rail assist to facilitate better stretch in calf and ankle ROM;  Denies any pain at end of session, good motivation demonstrated. Intermittent cues required for form/technique with exercises.                       PT Education - 08/06/17 [phone]    Education provided Yes   Education Details Exercise form/technique   Person(s) Educated  Patient   Methods Explanation   Comprehension Verbalized understanding          PT Short Term Goals - 07/16/17 1543      PT SHORT TERM GOAL #1   Title Patient will reduce timed up and go to <13 seconds to reduce fall risk and demonstrate improved transfer/gait ability.   Baseline 15.88 sec   Time 6   Period Weeks   Status New   Target Date 08/27/17     PT SHORT TERM GOAL #2   Title Patient (> 72 years old) will complete five times sit to stand test in < 18 seconds indicating an increased LE strength and improved balance.   Baseline 20.39 sec   Time 6   Period Weeks   Status New   Target Date 08/27/17     PT SHORT TERM GOAL #3   Title Patient will ambulate without spc indoors to be able to ambulate household distances and carry items.    Baseline Patient ambulates wiht spc   Time 6   Period Weeks   Status New   Target Date 08/27/17           PT Long Term Goals - 07/16/17 1532      PT LONG TERM GOAL #1   Title Patient will be independent in home exercise program to improve strength/mobility for better functional independence with ADLs.   Time 12   Period Weeks   Status New   Target Date 10/08/17     PT LONG TERM GOAL #2   Title Patient (> 72 years old) will complete five times sit to stand test in < 15 seconds indicating an increased LE strength and improved balance.   Baseline 20.39 sec   Time 12   Period Weeks   Status New   Target Date 10/08/17     PT LONG TERM GOAL #3   Title Patient will reduce timed up and go to <11 seconds to reduce fall risk and demonstrate improved transfer/gait ability.   Baseline 15.88 sec   Time 12   Period Weeks   Status New   Target Date 10/08/17     PT LONG TERM GOAL #4   Title Patient will ambulate wihtout AD for outside distances and uneven surfaces to be able to play golf.    Baseline Patient uses spc and ambulates intermediate distances.   Time 12   Period Weeks   Status New   Target Date 10/08/17                Plan - 08/06/17 0944    Clinical Impression Statement Pt demonstrates good strength with sit to stand with overhead press. He is fatigued appropriately by his last repetition of each set. He struggles performing horizontal and vertical head turns in full tandem stance.  Pt encouraged to continue HEP and follow-up as scheduled. Pt will benefit from PT services to address deficits in strength, balance, and mobility in order to return to full function at home.    Rehab Potential Good   PT Frequency 2x / week   PT Duration 12 weeks   PT Treatment/Interventions Aquatic Therapy;Cryotherapy;Moist Heat;Gait training;Stair training;Functional mobility training;Neuromuscular re-education;Balance training;Therapeutic exercise;Therapeutic activities;Patient/family education;Manual techniques   PT Next Visit Plan dynamic balance training - focus on single limb stability and varying BOS   PT Home Exercise Plan continue as given;    Consulted and Agree with Plan of Care Patient;Family member/caregiver      Patient will benefit from skilled therapeutic intervention in order to improve the following deficits and impairments:  Decreased balance, Decreased endurance, Decreased mobility, Difficulty walking, Dizziness, Decreased activity tolerance, Decreased strength, Abnormal gait  Visit Diagnosis: Muscle weakness (generalized)  Difficulty in walking, not elsewhere classified     Problem List Patient Active Problem List   Diagnosis Date Noted  . Special screening for malignant neoplasms, colon 07/02/2016   Phillips Grout PT, DPT   Lillyann Ahart 08/06/2017, 3:15 PM  [place] MAIN [facility] SERVICES [address] [place], [place], [address] Phone: [phone]   Fax:  [phone]  Name: Tyrone Fox MRN: [id number] Date of Birth: 08/09/1946

## 2017-08-06 NOTE — Therapy (Signed)
[place] MAIN [facility] SERVICES [address] [place], [place], [address] Phone: [phone]   Fax:  [phone]  Speech Language Pathology Evaluation  Patient Details  Name: Tyrone Fox MRN: [id number] Date of Birth: 1946-07-17 Referring Provider: Dr. Kary Kos  Encounter Date: 08/05/2017      End of Session - 08/06/17 1118    Visit Number 1   Number of Visits 1   SLP Start Time 1000   SLP Stop Time  1050   SLP Time Calculation (min) 50 min   Activity Tolerance Patient tolerated treatment well      History reviewed. No pertinent past medical history.  Past Surgical History:  Procedure Laterality Date  . CHOLECYSTECTOMY    . COLONOSCOPY N/A 03/04/2017   Procedure: COLONOSCOPY;  Surgeon: Rogene Houston, MD;  [facility];  Service: Endoscopy;  Laterality: N/A;  830  . FLEXIBLE BRONCHOSCOPY N/A 02/05/2017   Procedure: FLEXIBLE BRONCHOSCOPY;  Surgeon: [name], MD;  Location: [facility] ORS;  Service: Pulmonary;  Laterality: N/A;  . TONSILLECTOMY      There were no vitals filed for this visit.      Subjective Assessment - 08/06/17 1106    Subjective Pt was pleasant and cooperative. Pt agreeable to evaluation.   Currently in Pain? No/denies            SLP Evaluation [facility] - 08/06/17 1106      SLP Visit Information   SLP Received On 08/05/17   Referring Provider Dr. Kary Kos   Onset Date 06/18/17   Medical Diagnosis CVA     Subjective   Subjective Pt was pleasant and greeable to evalution.     General Information   HPI Pt. is a 71 y.o. male who was admitted to [facility] on 06/18/2017 following a CVA. Pt. was at [facility] for 24 hours. Pt. was discharged, and flew to [facility]. Pt. was admitted to the Hospital in [place], and had a thorough workup. Pt. was in [place] for 3 weeks, and had one outpatient therapy session while in [place]. Pt. has returned to [place] to continue tx.    Mobility Status  ambulatory     Prior Functional Status   Cognitive/Linguistic Baseline Within functional limits    Lives With Spouse   Vocation Part time employment     Cognition   Overall Cognitive Status Impaired/Different from baseline   Area of Impairment Orientation  Executive funciton   Orientation Level Disoriented to  date only   General Comments Pt commented he does not keep up with the date   Executive Function Sequencing   Sequencing --  See assessment results     Auditory Comprehension   Overall Auditory Comprehension Appears within functional limits for tasks assessed     Visual Recognition/Discrimination   Discrimination Within Function Limits     Reading Comprehension   Reading Status Within funtional limits     Expression   Primary Mode of Expression Verbal     Verbal Expression   Overall Verbal Expression Appears within functional limits for tasks assessed   Level of Generative/Spontaneous Verbalization Conversation     Oral Motor/Sensory Function   Overall Oral Motor/Sensory Function Appears within functional limits for tasks assessed   Labial ROM Within Functional Limits   Labial Symmetry Abnormal symmetry left   Labial Strength Within Functional Limits   Labial Sensation Within Functional Limits   Labial Coordination WFL   Lingual ROM Within Functional Limits   Lingual  Symmetry Within Functional Limits   Lingual Strength Within Functional Limits   Lingual Sensation Within Functional Limits   Lingual Coordination WFL   Facial ROM Reduced left   Facial Symmetry Left droop   Facial Strength Within Functional Limits   Facial Sensation Within Functional Limits   Velum Within Functional Limits   Mandible Within Functional Limits   Overall Oral Motor/Sensory Function Although, left sided facial/labial droop observed. Does not affect speech intelligibility or oral motor functionality     Motor Speech   Overall Motor Speech Appears within functional limits for tasks  assessed   Respiration Within functional limits   Phonation Normal   Articulation Within functional limitis   Intelligibility Intelligible   Motor Planning Witnin functional limits   Motor Speech Errors Not applicable     Standardized Assessments   Standardized Assessments  Montreal Cognitive Assessment (MOCA)  Western Aphasia Battery (Bedside)       Montreal Cognitive Assessment (MOCA) Version 8.1  Visuospatial/Executive Alternating trail making   0/1 Civil engineer, contracting (copy 3-d design) 1/1 Draw a Clock      3/3 Naming     3/3 Attention Forward Digit Span    1/1 Backward Digit Span    1/1 Vigilance     1/1 Serial 7's     2/3 Language Repetition     2/2 Verbal Fluency    0/1 Abstraction     2/2 Delayed Recall    4/5 Orientation     4/6  Western Aphasia Battery (Bedside)  Spontaneous Speech Content   10/10 Spontaneous Speech Fluency   10/10 Auditory Verbal Comprehension:  Yes /No Questions     10/10 Sequential Commands    10/10  Repetition      10/10 Object Naming     10/10 Reading      10/10                   SLP Education - 08/06/17 1117    Education provided Yes   Education Details Results of evaluation; labial/facial exercises to aid with facial droop   Person(s) Educated Patient   Methods Explanation   Comprehension Verbalized understanding              Plan - 08/06/17 1119    Clinical Impression Statement This 71 year old male post CVA on 06/18/17 presents w/mild cognitive communication deficits. Pt demonstrated mild deficits in visuospatial/executive function, generative naming and orientation to date. Pt also assessed for expressive/receptive language and speech deficits. Pt demonstrated no expressive or receptive language deficits and was able to appropriately converse with ST. Oral motor examination revealed a left sided facial droop, however this did not appear to functionally inhibit patient's intelligibility or strength for  controlling saliva or eating and drinking. Discussed results of evaluation with patient. Pt feels that he is at a baseline for his cognitive skills and that he is able to complete all ADL's at home independently. Pt does not wish to seek ST treatment at this time. Encouraged pt that if begins to see deficits especially in concentration or high level executive skills to re-consult with ST. Will also provide pt with written education on improving left facial droop at home. Pt in agreement.    Speech Therapy Frequency One time visit   Duration Other (comment)  one time visit   Treatment/Interventions Patient/family education   SLP Home Exercise Plan Lingual/labial exercises   Consulted and Agree with Plan of Care Patient      Patient will  benefit from skilled therapeutic intervention in order to improve the following deficits and impairments:   Cognitive communication deficit      G-Codes - 04-Sep-2017 1124    Functional Assessment Tool Used Clinical judgment, MOCA, WAB    Functional Limitations Memory   Memory Current Status (T0354) At least 1 percent but less than 20 percent impaired, limited or restricted   Memory Goal Status (S5681) At least 1 percent but less than 20 percent impaired, limited or restricted   Memory Discharge Status (G9170) At least 1 percent but less than 20 percent impaired, limited or restricted      Problem List Patient Active Problem List   Diagnosis Date Noted  . Special screening for malignant neoplasms, colon 07/02/2016   Tyrone Fox, [place], [place]  Speech-Language Pathologist   [place] 04-Sep-2017, 11:27 AM  [place] [address] [place], [place], [address] Phone: [phone]   Fax:  [phone]  Name: Tyrone Fox MRN: [id number] Date of Birth: 11-Aug-1946

## 2017-08-10 ENCOUNTER — Ambulatory Visit: Payer: Medicare Other | Admitting: Speech Pathology

## 2017-08-10 ENCOUNTER — Ambulatory Visit: Payer: Medicare Other

## 2017-08-10 DIAGNOSIS — R278 Other lack of coordination: Secondary | ICD-10-CM

## 2017-08-10 DIAGNOSIS — M6281 Muscle weakness (generalized): Secondary | ICD-10-CM | POA: Diagnosis not present

## 2017-08-10 DIAGNOSIS — R262 Difficulty in walking, not elsewhere classified: Secondary | ICD-10-CM

## 2017-08-10 NOTE — Therapy (Signed)
[place] MAIN [facility] SERVICES [address] [place], [place], [address] Phone: [phone]   Fax:  [phone]  Physical Therapy Treatment  Patient Details  Name: Tyrone Fox MRN: [id number] Date of Birth: 30-Oct-1946 Referring Provider: [name]  Encounter Date: 08/10/2017      PT End of Session - 08/10/17 1521    Visit Number 8   Number of Visits 25   Date for PT Re-Evaluation 10/08/17   Authorization Type 8/10 g codes   PT Start Time 8466   PT Stop Time 1600   PT Time Calculation (min) 45 min   Equipment Utilized During Treatment Gait belt   Activity Tolerance Patient tolerated treatment well;Patient limited by fatigue   Behavior During Therapy [facility] for tasks assessed/performed      History reviewed. No pertinent past medical history.  Past Surgical History:  Procedure Laterality Date  . CHOLECYSTECTOMY    . COLONOSCOPY N/A 03/04/2017   Procedure: COLONOSCOPY;  Surgeon: Rogene Houston, MD;  [facility];  Service: Endoscopy;  Laterality: N/A;  830  . FLEXIBLE BRONCHOSCOPY N/A 02/05/2017   Procedure: FLEXIBLE BRONCHOSCOPY;  Surgeon: [name], MD;  Location: [facility] ORS;  Service: Pulmonary;  Laterality: N/A;  . TONSILLECTOMY      There were no vitals filed for this visit.      Subjective Assessment - 08/10/17 1519    Subjective Patient had a rough weekend. got a ticket while driving. Patient been going to the gym at least 5x/ week. Increased time on elliptical to 7 mins this morning. No questions or concerns at this time. Feeling ok physically right now.    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 He was there 24 hours. Then he stayed at his daughter for 3 weeks. He went to out patient therapy 1 time and it was decided that her should wait unitl he flew back home to have the  therapy. He got home from [place] stated spet 5th.    Limitations Standing;Walking   How long can you sit comfortably? unlimited   How long can you stand comfortably? he gets tired but at least 30 mins   How long can you walk comfortably? intermediate distances with spc   Patient Stated Goals Patient wants to be able to walk long distances, have more energy, stay awake.   Currently in Pain? No/denies         Warm up on Nustep BUE/BLE level 4 x 3 min during history;      Neuromuscular Re-education 6" step toe taps without UE support 20x 6" step lateral toe taps without UE support 20x each leg   Tandem stance: 2x60 seconds   Semi tandem stance (modified) horizontal head turns 2x60 seconds, 2x60 vertical head turns  Ambulating in hallway with PT calling left, right, up or down, frequent stumbling with right, up, and down head turns. dizziness  VOR 1 x 2 trials of 60 seconds, increased dizziness   Airex beam: Tandem gait x 6 laps without rail assist with CGA for safety and cues to narrow base of support to increase balance challenge; Side stepping x 4 laps each direction without rail assist, close supervision for safety;   airex pad: eyes closed 60 seconds occasional trunk sway.   airex pad: step over and back over hurdle onto additional Airex pad  20x each leg, frequent LOB., able to self correct  Backwards walking in // bars 8x, cues for increases step length. CGA.   Lunge onto yellow dynamic disc 10x each leg, CGA   Denies any pain at end of session, good motivation demonstrated. Intermittent cues required for form/technique with exercises.                       PT Education - 08/10/17 1520    Education provided Yes   Education Details Economist for functional balance and strength   Person(s) Educated Patient   Methods Explanation;Demonstration;Verbal cues   Comprehension Verbalized understanding;Returned demonstration          PT Short  Term Goals - 07/16/17 1543      PT SHORT TERM GOAL #1   Title Patient will reduce timed up and go to <13 seconds to reduce fall risk and demonstrate improved transfer/gait ability.   Baseline 15.88 sec   Time 6   Period Weeks   Status New   Target Date 08/27/17     PT SHORT TERM GOAL #2   Title Patient (> 60 years old) will complete five times sit to stand test in < 18 seconds indicating an increased LE strength and improved balance.   Baseline 20.39 sec   Time 6   Period Weeks   Status New   Target Date 08/27/17     PT SHORT TERM GOAL #3   Title Patient will ambulate without spc indoors to be able to ambulate household distances and carry items.    Baseline Patient ambulates wiht spc   Time 6   Period Weeks   Status New   Target Date 08/27/17           PT Long Term Goals - 07/16/17 1532      PT LONG TERM GOAL #1   Title Patient will be independent in home exercise program to improve strength/mobility for better functional independence with ADLs.   Time 12   Period Weeks   Status New   Target Date 10/08/17     PT LONG TERM GOAL #2   Title Patient (> 72 years old) will complete five times sit to stand test in < 15 seconds indicating an increased LE strength and improved balance.   Baseline 20.39 sec   Time 12   Period Weeks   Status New   Target Date 10/08/17     PT LONG TERM GOAL #3   Title Patient will reduce timed up and go to <11 seconds to reduce fall risk and demonstrate improved transfer/gait ability.   Baseline 15.88 sec   Time 12   Period Weeks   Status New   Target Date 10/08/17     PT LONG TERM GOAL #4   Title Patient will ambulate wihtout AD for outside distances and uneven surfaces to be able to play golf.    Baseline Patient uses spc and ambulates intermediate distances.   Time 12   Period Weeks   Status New   Target Date 10/08/17               Plan - 08/10/17 1751    Clinical Impression Statement Patient demonstrated decreased  ability to retain COM when turning head to the right. VOR increased dizziness indicating vestibular implications. Dynamic surfaces challenge patient's ability to retain COM and causes increased trunk sway. Patient will continue to benefit from skilled physical therapy services to address further balance impairments  and decrease fall risk.    Rehab Potential Good   PT Frequency 2x / week   PT Duration 12 weeks   PT Treatment/Interventions Aquatic Therapy;Cryotherapy;Moist Heat;Gait training;Stair training;Functional mobility training;Neuromuscular re-education;Balance training;Therapeutic exercise;Therapeutic activities;Patient/family education;Manual techniques   PT Next Visit Plan dynamic balance training - focus on single limb stability and varying BOS   PT Home Exercise Plan continue as given;    Consulted and Agree with Plan of Care Patient;Family member/caregiver      Patient will benefit from skilled therapeutic intervention in order to improve the following deficits and impairments:  Decreased balance, Decreased endurance, Decreased mobility, Difficulty walking, Dizziness, Decreased activity tolerance, Decreased strength, Abnormal gait  Visit Diagnosis: Muscle weakness (generalized)  Difficulty in walking, not elsewhere classified  Other lack of coordination     Problem List Patient Active Problem List   Diagnosis Date Noted  . Special screening for malignant neoplasms, colon 07/02/2016   Janna Arch, PT, DPT   Janna Arch 08/10/2017, 5:52 PM  [place] MAIN [facility] SERVICES [address] [place], [place], [address] Phone: [phone]   Fax:  [phone]  Name: BABAK LUCUS MRN: [id number] Date of Birth: Jan 17, 1946

## 2017-08-12 ENCOUNTER — Encounter: Payer: Medicare Other | Admitting: Occupational Therapy

## 2017-08-12 ENCOUNTER — Ambulatory Visit: Payer: Medicare Other | Admitting: Speech Pathology

## 2017-08-12 ENCOUNTER — Encounter: Payer: Self-pay | Admitting: Physical Therapy

## 2017-08-12 ENCOUNTER — Ambulatory Visit: Payer: Medicare Other | Admitting: Physical Therapy

## 2017-08-12 DIAGNOSIS — M6281 Muscle weakness (generalized): Secondary | ICD-10-CM

## 2017-08-12 DIAGNOSIS — R262 Difficulty in walking, not elsewhere classified: Secondary | ICD-10-CM

## 2017-08-12 NOTE — Therapy (Signed)
[place] MAIN [facility] SERVICES [address] [place], [place], [address] Phone: [phone]   Fax:  [phone]  Physical Therapy Treatment  Patient Details  Name: Tyrone Fox MRN: [id number] Date of Birth: 11/17/1945 Referring Provider: [name]  Encounter Date: 08/12/2017      PT End of Session - 08/12/17 1431    Visit Number 9   Number of Visits 25   Date for PT Re-Evaluation 10/08/17   Authorization Type 9/10 g codes   PT Start Time 7616   PT Stop Time 1510   PT Time Calculation (min) 45 min   Equipment Utilized During Treatment Gait belt   Activity Tolerance Patient tolerated treatment well;Patient limited by fatigue   Behavior During Therapy [facility] for tasks assessed/performed      History reviewed. No pertinent past medical history.  Past Surgical History:  Procedure Laterality Date  . CHOLECYSTECTOMY    . COLONOSCOPY N/A 03/04/2017   Procedure: COLONOSCOPY;  Surgeon: Rogene Houston, MD;  [facility];  Service: Endoscopy;  Laterality: N/A;  830  . FLEXIBLE BRONCHOSCOPY N/A 02/05/2017   Procedure: FLEXIBLE BRONCHOSCOPY;  Surgeon: [name], MD;  Location: [facility] ORS;  Service: Pulmonary;  Laterality: N/A;  . TONSILLECTOMY      There were no vitals filed for this visit.      Subjective Assessment - 08/12/17 1429    Subjective Pt states that he is doing well, states that he has gone to the gym every day and continues to work on his strength at home.  He states that he is still having some balance issues and issues with dizziness.    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 He was there 24 hours. Then he stayed at his daughter for 3 weeks. He went to out patient therapy 1 time and it was decided that her should wait unitl he flew back home to have the therapy. He got home from  [place] stated spet 5th.    Limitations Standing;Walking   How long can you sit comfortably? unlimited   How long can you stand comfortably? he gets tired but at least 30 mins   How long can you walk comfortably? intermediate distances with spc   Patient Stated Goals Patient wants to be able to walk long distances, have more energy, stay awake.   Currently in Pain? No/denies   Multiple Pain Sites No     Treatment:  NuStep x 5 min    Toe taps to 6 in step x 20 B LE standing on foam pad  Lateral toe taps to 6 in step x 20 B LE standing on foam   Fwd and reverse steps from foam pads over 1/2 foam roller x 20 reps with SBA for safety - 2 LOB noted with ability to self-correct   Lateral walks on 1/2 foam x 3 laps with CGA for safety   Card taps from foam beam x 20 each direction   Chest press with 1 lb rod standing on foam pad x 20 reps   UE flexion with 1 lb rod standing on foam pad x 20 reps    Ball toss in regular BOS x 45 tosses - no LOB noted; ball thrown at different heights and angles to challenge balance   Ball toss in narrow stance x 45 tosses -  improved postural reactions noted   Lunges into yellow disc x 20 B LE's - visual demonstration for proper technique   Rockerboard fwd and backward x 20 reps without UE support    Rockerboard balance: fwd/backward x 30 sec; lateral x 30 sec - decreased stability with lateral direction                         PT Education - 08/12/17 1430    Education provided Yes   Education Details cont. to exercise regularly   Person(s) Educated Patient   Methods Explanation;Demonstration   Comprehension Verbalized understanding;Returned demonstration          PT Short Term Goals - 07/16/17 1543      PT SHORT TERM GOAL #1   Title Patient will reduce timed up and go to <13 seconds to reduce fall risk and demonstrate improved transfer/gait ability.   Baseline 15.88 sec   Time 6   Period Weeks   Status New    Target Date 08/27/17     PT SHORT TERM GOAL #2   Title Patient (> 69 years old) will complete five times sit to stand test in < 18 seconds indicating an increased LE strength and improved balance.   Baseline 20.39 sec   Time 6   Period Weeks   Status New   Target Date 08/27/17     PT SHORT TERM GOAL #3   Title Patient will ambulate without spc indoors to be able to ambulate household distances and carry items.    Baseline Patient ambulates wiht spc   Time 6   Period Weeks   Status New   Target Date 08/27/17           PT Long Term Goals - 07/16/17 1532      PT LONG TERM GOAL #1   Title Patient will be independent in home exercise program to improve strength/mobility for better functional independence with ADLs.   Time 12   Period Weeks   Status New   Target Date 10/08/17     PT LONG TERM GOAL #2   Title Patient (> 80 years old) will complete five times sit to stand test in < 15 seconds indicating an increased LE strength and improved balance.   Baseline 20.39 sec   Time 12   Period Weeks   Status New   Target Date 10/08/17     PT LONG TERM GOAL #3   Title Patient will reduce timed up and go to <11 seconds to reduce fall risk and demonstrate improved transfer/gait ability.   Baseline 15.88 sec   Time 12   Period Weeks   Status New   Target Date 10/08/17     PT LONG TERM GOAL #4   Title Patient will ambulate wihtout AD for outside distances and uneven surfaces to be able to play golf.    Baseline Patient uses spc and ambulates intermediate distances.   Time 12   Period Weeks   Status New   Target Date 10/08/17               Plan - 08/12/17 1509    Clinical Impression Statement Pt demonstrated improved postural reactions with dynamic balance activities today, with ability to self correct with LOB noted.  Pt was able to reach outside BOS laterally and in up and down directions with stability even on uneven surfaces.  Pt was able to progress balance  activities, but difficulty was  noted with activities involving the half-foam roller.  Pt was encoouraged to continue exercise activities at home to further progress LE strength and balance.  Pt would continue to benefit from skilled therapy services in order to further address balance deficits in order to reduce risk of falls.   Rehab Potential Good   PT Frequency 2x / week   PT Duration 12 weeks   PT Treatment/Interventions Aquatic Therapy;Cryotherapy;Moist Heat;Gait training;Stair training;Functional mobility training;Neuromuscular re-education;Balance training;Therapeutic exercise;Therapeutic activities;Patient/family education;Manual techniques   PT Next Visit Plan dynamic balance training - focus on single limb stability and varying BOS   PT Home Exercise Plan continue as given;    Consulted and Agree with Plan of Care Patient;Family member/caregiver      Patient will benefit from skilled therapeutic intervention in order to improve the following deficits and impairments:  Decreased balance, Decreased endurance, Decreased mobility, Difficulty walking, Dizziness, Decreased activity tolerance, Decreased strength, Abnormal gait  Visit Diagnosis: Muscle weakness (generalized)  Difficulty in walking, not elsewhere classified     Problem List Patient Active Problem List   Diagnosis Date Noted  . Special screening for malignant neoplasms, colon 07/02/2016   This entire session was performed under direct supervision and direction of a licensed therapist/therapist assistant . I have personally read, edited and approve of the note as written. Netta Corrigan, SPT Alanson Puls, PT, DPT  08/12/2017, 3:12 PM  [place] MAIN [facility] SERVICES [address] [place], [place], [address] Phone: [phone]   Fax:  [phone]  Name: Tyrone Fox MRN: [id number] Date of Birth: 1946/01/03

## 2017-08-17 ENCOUNTER — Ambulatory Visit: Payer: Medicare Other | Admitting: Speech Pathology

## 2017-08-17 ENCOUNTER — Encounter: Payer: Self-pay | Admitting: Physical Therapy

## 2017-08-17 ENCOUNTER — Ambulatory Visit: Payer: Medicare Other | Admitting: Physical Therapy

## 2017-08-17 VITALS — BP 124/77 | HR 76

## 2017-08-17 DIAGNOSIS — R278 Other lack of coordination: Secondary | ICD-10-CM

## 2017-08-17 DIAGNOSIS — R262 Difficulty in walking, not elsewhere classified: Secondary | ICD-10-CM

## 2017-08-17 DIAGNOSIS — M6281 Muscle weakness (generalized): Secondary | ICD-10-CM

## 2017-08-17 NOTE — Therapy (Signed)
[place] MAIN [facility] SERVICES [address] [place], [place], [address] Phone: [phone]   Fax:  [phone]  Physical Therapy Treatment  Patient Details  Name: Tyrone Fox MRN: [id number] Date of Birth: 24-Nov-1945 Referring Provider: [name]  Encounter Date: 08/17/2017      PT End of Session - 08/17/17 1515    Visit Number 10   Number of Visits 25   Date for PT Re-Evaluation 10/08/17   Authorization Type 10/10 g codes   PT Start Time 3584   PT Stop Time 1556   PT Time Calculation (min) 41 min   Equipment Utilized During Treatment Gait belt   Activity Tolerance Patient tolerated treatment well;Patient limited by fatigue   Behavior During Therapy [facility] for tasks assessed/performed      History reviewed. No pertinent past medical history.  Past Surgical History:  Procedure Laterality Date  . CHOLECYSTECTOMY    . COLONOSCOPY N/A 03/04/2017   Procedure: COLONOSCOPY;  Surgeon: Rogene Houston, MD;  [facility];  Service: Endoscopy;  Laterality: N/A;  830  . FLEXIBLE BRONCHOSCOPY N/A 02/05/2017   Procedure: FLEXIBLE BRONCHOSCOPY;  Surgeon: [name], MD;  Location: [facility] ORS;  Service: Pulmonary;  Laterality: N/A;  . TONSILLECTOMY      Vitals:   08/17/17 1520  BP: 124/77  Pulse: 76        Subjective Assessment - 08/17/17 1519    Subjective Pt saw his PCP today which went well.  PCP instructed the pt to continue monitoring his BP at home.  Pt has been going to the gym but has not been completing his HEP.    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 He was there 24 hours. Then he stayed at his daughter for 3 weeks. He went to out patient therapy 1 time and it was decided that her should wait unitl he flew back home to have the therapy. He got home from [place] stated spet 5th.     Limitations Standing;Walking   How long can you sit comfortably? unlimited   How long can you stand comfortably? he gets tired but at least 30 mins   How long can you walk comfortably? intermediate distances with spc   Patient Stated Goals Patient wants to be able to walk long distances, have more energy, stay awake.   Currently in Pain? No/denies      TREATMENT   5xSTS: 13.03 seconds, pt pushes on thighs with BUEs to boost to standing.  TUG: 7.14 seconds, no instability    TREATMENT  Therapeutic Exercise:  Sit<stand without use of UEs x10  Toe taps to 6 in step x 30 B LE standing on foam pad with LOB x1  Tandem walking x8 lengths in // bars requiring intermittent UE support due to LOB  SLS: 2x30 seconds each LE, requiring intermittent UE support for LOB. Up to 8 second on RLE, up to 18 seconds on the LLE.  Lateral stepping over cone x10 each direction without UE support  Agility ladder two steps forward into square, two steps back. X4 lengths  Agility ladder lateral stepping two steps in each square. X4 lengths  BLE leg press 105# 2x20, 120# x10  Single leg LLE leg press 75# 2x15          PT Education - 08/17/17 1515  Education provided Yes   Education Details Exercise technique; re-emphasized the importance of completing HEP as prescribed   Person(s) Educated Patient   Methods Explanation;Demonstration;Verbal cues   Comprehension Returned demonstration;Verbal cues required;Verbalized understanding;Need further instruction          PT Short Term Goals - 08/17/17 1553      PT SHORT TERM GOAL #1   Title Patient will reduce timed up and go to <13 seconds to reduce fall risk and demonstrate improved transfer/gait ability.   Baseline 15.88 sec; 10/15: 7.14 seconds   Time 6   Period Weeks   Status Achieved     PT SHORT TERM GOAL #2   Title Patient (> 55 years old) will complete five times sit to stand test in < 18 seconds indicating an increased LE strength and  improved balance.   Baseline 20.39 sec; 10/15: 13.03 seconds but pt pushes from thighs with BUEs   Time 6   Period Weeks   Status Partially Met     PT SHORT TERM GOAL #3   Title Patient will ambulate without spc indoors to be able to ambulate household distances and carry items.    Baseline Patient ambulates wiht spc; 10/15: pt does not use AD   Time 6   Period Weeks   Status Achieved           PT Long Term Goals - 07/16/17 1532      PT LONG TERM GOAL #1   Title Patient will be independent in home exercise program to improve strength/mobility for better functional independence with ADLs.   Time 12   Period Weeks   Status New   Target Date 10/08/17     PT LONG TERM GOAL #2   Title Patient (> 65 years old) will complete five times sit to stand test in < 15 seconds indicating an increased LE strength and improved balance.   Baseline 20.39 sec   Time 12   Period Weeks   Status New   Target Date 10/08/17     PT LONG TERM GOAL #3   Title Patient will reduce timed up and go to <11 seconds to reduce fall risk and demonstrate improved transfer/gait ability.   Baseline 15.88 sec   Time 12   Period Weeks   Status New   Target Date 10/08/17     PT LONG TERM GOAL #4   Title Patient will ambulate wihtout AD for outside distances and uneven surfaces to be able to play golf.    Baseline Patient uses spc and ambulates intermediate distances.   Time 12   Period Weeks   Status New   Target Date 10/08/17               Plan - 08/17/17 1548    Clinical Impression Statement Pt demonstrates instability with balance exercises, requiring UE support, especially for tandem balance and SLS exercises.  He did demonstrate significant improvement with his 5xSTS and TUG scores.  He demonstrated fatigue with strengthening exercises but tolerated all interventions well. He will benefit from continued skilled PT interventions for improved strength, coordination, and balance.    Rehab  Potential Good   PT Frequency 2x / week   PT Duration 12 weeks   PT Treatment/Interventions Aquatic Therapy;Cryotherapy;Moist Heat;Gait training;Stair training;Functional mobility training;Neuromuscular re-education;Balance training;Therapeutic exercise;Therapeutic activities;Patient/family education;Manual techniques   PT Next Visit Plan dynamic balance training - focus on single limb stability and varying BOS   PT Home Exercise Plan continue as given;  Consulted and Agree with Plan of Care Patient;Family member/caregiver      Patient will benefit from skilled therapeutic intervention in order to improve the following deficits and impairments:  Decreased balance, Decreased endurance, Decreased mobility, Difficulty walking, Dizziness, Decreased activity tolerance, Decreased strength, Abnormal gait  Visit Diagnosis: Muscle weakness (generalized)  Difficulty in walking, not elsewhere classified  Other lack of coordination       G-Codes - 08/27/17 1557    Functional Assessment Tool Used (Outpatient Only) TUG, 5xSTS, clinical judgement   Functional Limitation Mobility: Walking and moving around   Mobility: Walking and Moving Around Current Status (N1836) At least 1 percent but less than 20 percent impaired, limited or restricted   Mobility: Walking and Moving Around Goal Status [phone]) At least 1 percent but less than 20 percent impaired, limited or restricted      Problem List Patient Active Problem List   Diagnosis Date Noted  . Special screening for malignant neoplasms, colon 07/02/2016     Collie Siad PT, DPT 2017/08/27, 4:00 PM  [place] MAIN [facility] SERVICES [address] [place], [place], [address] Phone: [phone]   Fax:  [phone]  Name: Tyrone Fox MRN: [id number] Date of Birth: May 10, 1946

## 2017-08-20 ENCOUNTER — Ambulatory Visit: Payer: Medicare Other | Admitting: Physical Therapy

## 2017-08-20 ENCOUNTER — Encounter: Payer: Medicare Other | Admitting: Occupational Therapy

## 2017-08-20 ENCOUNTER — Encounter: Payer: Self-pay | Admitting: Physical Therapy

## 2017-08-20 DIAGNOSIS — R278 Other lack of coordination: Secondary | ICD-10-CM

## 2017-08-20 DIAGNOSIS — R262 Difficulty in walking, not elsewhere classified: Secondary | ICD-10-CM

## 2017-08-20 DIAGNOSIS — M6281 Muscle weakness (generalized): Secondary | ICD-10-CM | POA: Diagnosis not present

## 2017-08-20 NOTE — Therapy (Signed)
[place] MAIN [facility] SERVICES [address] [place], [place], [address] Phone: [phone]   Fax:  [phone]  Physical Therapy Treatment  Patient Details  Name: Tyrone Fox MRN: [id number] Date of Birth: 1946/05/18 Referring Provider: [name]  Encounter Date: 08/20/2017      PT End of Session - 08/20/17 1440    Visit Number 11   Number of Visits 25   Date for PT Re-Evaluation 10/08/17   Authorization Type 1/10 g codes   PT Start Time 1430   PT Stop Time 1515   PT Time Calculation (min) 45 min   Equipment Utilized During Treatment Gait belt   Activity Tolerance Patient tolerated treatment well;Patient limited by fatigue   Behavior During Therapy [facility] for tasks assessed/performed      History reviewed. No pertinent past medical history.  Past Surgical History:  Procedure Laterality Date  . CHOLECYSTECTOMY    . COLONOSCOPY N/A 03/04/2017   Procedure: COLONOSCOPY;  Surgeon: Rogene Houston, MD;  [facility];  Service: Endoscopy;  Laterality: N/A;  830  . FLEXIBLE BRONCHOSCOPY N/A 02/05/2017   Procedure: FLEXIBLE BRONCHOSCOPY;  Surgeon: [name], MD;  Location: [facility] ORS;  Service: Pulmonary;  Laterality: N/A;  . TONSILLECTOMY      There were no vitals filed for this visit.      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 He was there 24 hours. Then he stayed at his daughter for 3 weeks. He went to out patient therapy 1 time and it was decided that her should wait unitl he flew back home to have the therapy. He got home from [place] stated spet 5th.    Limitations Standing;Walking   How long can you sit comfortably? unlimited   How long can  you stand comfortably? he gets tired but at least 30 mins   How long can you walk comfortably? intermediate distances with spc   Patient Stated Goals Patient wants to be able to walk long distances, have more energy, stay awake.   Currently in Pain? No/denies        Treatment:             NuStep BUE/BLE level 3 x4 min (unbilled);                 Neuro Re-ed Airex beam: Tandem gait x4 laps without rail assist with CGA for safety and cues to narrow base of support to increase balance challenge; Side stepping x4 laps each direction without rail assist, close supervision for safety; Tandem stance with BUE ball pass side/side x3 passes, x2 sets each foot in front with min A for safety and cues to slow down upper trunk rotation for better stance control;  Standing on 1/2 bolster (flat side up): Heel/toe raises x15 with rail assist to facilitate better stretch in calf and ankle ROM; BUE wand flexion with feet balanced in middle (working on ankle strategies to keep stance control) 2x10; Requires cues to improve upper trunk control and slow down UE movement for less loss of balance;   SLS on firm surface, with 1-0 rail assist, x5 sec hold x3 reps each LE with cues to increase upper trunk control,  keep knees together for better stance control;   Resisted walking 12.5# forward/backward (lateral head turns) , side/side (up/down head turns) 4 way (each direction) x2 laps each with min A for safety and cues to increase step length and slow down eccentric return for better balance control;    Exercise: Seated: Green tband ankle DF 2x15 bilaterally; Requires min Vcs for correct foot positioning to improve ankle strengthening;  Green tband hip flexion march x15 bilaterally with cues to keep support against back of chair for better hip strengthening;   Sit<>Stand from regular chair with BUE yellow resisted ball lift (4#) x10 reps with cues to increase push through LE for better strengthening;     Patient tolerated session well; Denies any pain at end of session;                      PT Education - 08/20/17 1440    Education provided Yes   Education Details balance exercise, HEP reinforced;    Person(s) Educated Patient   Methods Explanation;Demonstration;Verbal cues   Comprehension Verbalized understanding;Returned demonstration;Verbal cues required;Need further instruction          PT Short Term Goals - 08/17/17 1553      PT SHORT TERM GOAL #1   Title Patient will reduce timed up and go to <13 seconds to reduce fall risk and demonstrate improved transfer/gait ability.   Baseline 15.88 sec; 10/15: 7.14 seconds   Time 6   Period Weeks   Status Achieved     PT SHORT TERM GOAL #2   Title Patient (> 73 years old) will complete five times sit to stand test in < 18 seconds indicating an increased LE strength and improved balance.   Baseline 20.39 sec; 10/15: 13.03 seconds but pt pushes from thighs with BUEs   Time 6   Period Weeks   Status Partially Met     PT SHORT TERM GOAL #3   Title Patient will ambulate without spc indoors to be able to ambulate household distances and carry items.    Baseline Patient ambulates wiht spc; 10/15: pt does not use AD   Time 6   Period Weeks   Status Achieved           PT Long Term Goals - 07/16/17 1532      PT LONG TERM GOAL #1   Title Patient will be independent in home exercise program to improve strength/mobility for better functional independence with ADLs.   Time 12   Period Weeks   Status New   Target Date 10/08/17     PT LONG TERM GOAL #2   Title Patient (> 42 years old) will complete five times sit to stand test in < 15 seconds indicating an increased LE strength and improved balance.   Baseline 20.39 sec   Time 12   Period Weeks   Status New   Target Date 10/08/17     PT LONG TERM GOAL #3   Title Patient will reduce timed up and go to <11 seconds to reduce fall risk and demonstrate  improved transfer/gait ability.   Baseline 15.88 sec   Time 12   Period Weeks   Status New   Target Date 10/08/17     PT LONG TERM GOAL #4   Title Patient will ambulate wihtout AD for outside distances and uneven surfaces to be able to play golf.    Baseline Patient uses spc and ambulates intermediate distances.   Time 12  Period Weeks   Status New   Target Date 10/08/17               Plan - 08/20/17 1503    Clinical Impression Statement Patient instructed in advanced balance exercise. He had increased difficulty with narrow base of support on uneven surfaces. Reinforced HEP with instruction on SLS and ways to improve stance control including to increase core stabilization and keep legs close together. Patient able to hold SLS for 5-10 sec at times. Patient would benefit from additional skilled PT intervention to improve strength, balance and gait safety;    Rehab Potential Good   PT Frequency 2x / week   PT Duration 12 weeks   PT Treatment/Interventions Aquatic Therapy;Cryotherapy;Moist Heat;Gait training;Stair training;Functional mobility training;Neuromuscular re-education;Balance training;Therapeutic exercise;Therapeutic activities;Patient/family education;Manual techniques   PT Next Visit Plan dynamic balance training - focus on single limb stability and varying BOS   PT Home Exercise Plan continue as given;    Consulted and Agree with Plan of Care Patient;Family member/caregiver      Patient will benefit from skilled therapeutic intervention in order to improve the following deficits and impairments:  Decreased balance, Decreased endurance, Decreased mobility, Difficulty walking, Dizziness, Decreased activity tolerance, Decreased strength, Abnormal gait  Visit Diagnosis: Muscle weakness (generalized)  Difficulty in walking, not elsewhere classified  Other lack of coordination     Problem List Patient Active Problem List   Diagnosis Date Noted  . Special  screening for malignant neoplasms, colon 07/02/2016    Trotter,Margaret PT, DPT 08/20/2017, 3:20 PM  [place] MAIN [facility] SERVICES [address] [place], [place], [address] Phone: [phone]   Fax:  [phone]  Name: LAVEL RIEMAN MRN: [id number] Date of Birth: Aug 21, 1946

## 2017-08-24 ENCOUNTER — Ambulatory Visit: Payer: Medicare Other | Admitting: Physical Therapy

## 2017-08-24 ENCOUNTER — Ambulatory Visit: Payer: Medicare Other | Admitting: Speech Pathology

## 2017-08-24 ENCOUNTER — Ambulatory Visit: Payer: Medicare Other

## 2017-08-24 ENCOUNTER — Encounter: Payer: Medicare Other | Admitting: Occupational Therapy

## 2017-08-24 DIAGNOSIS — M6281 Muscle weakness (generalized): Secondary | ICD-10-CM | POA: Diagnosis not present

## 2017-08-24 DIAGNOSIS — R278 Other lack of coordination: Secondary | ICD-10-CM

## 2017-08-24 DIAGNOSIS — R262 Difficulty in walking, not elsewhere classified: Secondary | ICD-10-CM

## 2017-08-24 NOTE — Therapy (Signed)
[place] MAIN [facility] SERVICES [address] [place], [place], [address] Phone: [phone]   Fax:  [phone]  Physical Therapy Treatment  Patient Details  Name: Tyrone Fox MRN: [id number] Date of Birth: 04/18/1946 Referring Provider: [name]  Encounter Date: 08/24/2017      PT End of Session - 08/24/17 1119    Visit Number 12   Number of Visits 25   Date for PT Re-Evaluation 10/08/17   Authorization Type 2/10 g codes   PT Start Time 18-Dec-1113   PT Stop Time 1200   PT Time Calculation (min) 45 min   Equipment Utilized During Treatment Gait belt   Activity Tolerance Patient tolerated treatment well;Patient limited by fatigue   Behavior During Therapy [facility] for tasks assessed/performed      No past medical history on file.  Past Surgical History:  Procedure Laterality Date  . CHOLECYSTECTOMY    . COLONOSCOPY N/A 03/04/2017   Procedure: COLONOSCOPY;  Surgeon: Rogene Houston, MD;  Location: [facility];  Service: Endoscopy;  Laterality: N/A;  830  . FLEXIBLE BRONCHOSCOPY N/A 02/05/2017   Procedure: FLEXIBLE BRONCHOSCOPY;  Surgeon: [name], MD;  Location: [facility] ORS;  Service: Pulmonary;  Laterality: N/A;  . TONSILLECTOMY      There were no vitals filed for this visit.      Subjective Assessment - 08/24/17 1118    Subjective Patient overdid it yesterday "airating" his lawn, feeling sore today but no pain. Going to [facility]  Wednesday for 50th anniversary.    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 He was there 24 hours. Then he stayed at his daughter for 3 weeks. He went to out patient therapy 1 time and it was decided that her should wait unitl he flew back home to have the therapy. He got home from [place] stated spet 5th.    Limitations Standing;Walking   How long can you sit  comfortably? unlimited   How long can you stand comfortably? he gets tired but at least 30 mins   How long can you walk comfortably? intermediate distances with spc   Patient Stated Goals Patient wants to be able to walk long distances, have more energy, stay awake.   Currently in Pain? No/denies             Treatment:             NuStep BUE/BLE level 3 x3 min (unbilled);                 Neuro Re-ed  Airex beam: Tandem gait x8 laps without rail assist with CGA for safety and cues to narrow base of support to increase balance challenge; Side stepping x4 laps each direction without rail assist, close supervision for safety;  Tandem stance: weighted ball BUE raises, CGA, cues for keeping chest up, feet positioning, and straightened arms. 10x each foot in front (2x) total  Tandem stance: weighted ball wood chops 10x each foot in front (2x total) Cues for straightening arms, occasional LOB.   Airex pad: balloon taps 40 throws  Ambulating  In hallway horizontal and vertical head turns, 2x100 ft each direction with PT cuiing. 2x100 with all directions.    TherEx  20 heel raises 2x10 single limb heel raises finger tip support Sit to stand from plinth with 3000g weighted ball  raises 10x ,  Sit to stand from plinth with 3000 g weighted ball and horizontal head turns 10x. No LOB                    PT Education - 08/24/17 1118    Education provided Yes   Education Details balance progressive, narrow BOS   Person(s) Educated Patient   Methods Explanation;Demonstration;Verbal cues   Comprehension Verbalized understanding;Returned demonstration          PT Short Term Goals - 08/17/17 1553      PT SHORT TERM GOAL #1   Title Patient will reduce timed up and go to <13 seconds to reduce fall risk and demonstrate improved transfer/gait ability.   Baseline 15.88 sec; 10/15: 7.14 seconds   Time 6   Period Weeks   Status Achieved     PT SHORT TERM GOAL #2   Title  Patient (> 72 years old) will complete five times sit to stand test in < 18 seconds indicating an increased LE strength and improved balance.   Baseline 20.39 sec; 10/15: 13.03 seconds but pt pushes from thighs with BUEs   Time 6   Period Weeks   Status Partially Met     PT SHORT TERM GOAL #3   Title Patient will ambulate without spc indoors to be able to ambulate household distances and carry items.    Baseline Patient ambulates wiht spc; 10/15: pt does not use AD   Time 6   Period Weeks   Status Achieved           PT Long Term Goals - 07/16/17 1532      PT LONG TERM GOAL #1   Title Patient will be independent in home exercise program to improve strength/mobility for better functional independence with ADLs.   Time 12   Period Weeks   Status New   Target Date 10/08/17     PT LONG TERM GOAL #2   Title Patient (> 72 years old) will complete five times sit to stand test in < 15 seconds indicating an increased LE strength and improved balance.   Baseline 20.39 sec   Time 12   Period Weeks   Status New   Target Date 10/08/17     PT LONG TERM GOAL #3   Title Patient will reduce timed up and go to <11 seconds to reduce fall risk and demonstrate improved transfer/gait ability.   Baseline 15.88 sec   Time 12   Period Weeks   Status New   Target Date 10/08/17     PT LONG TERM GOAL #4   Title Patient will ambulate wihtout AD for outside distances and uneven surfaces to be able to play golf.    Baseline Patient uses spc and ambulates intermediate distances.   Time 12   Period Weeks   Status New   Target Date 10/08/17               Plan - 08/24/17 1157    Clinical Impression Statement Patient challenged by narrow BOS. Static and dynamic movement performed with narrowing BO S and tandem stance.  Frequent cueing for speed of task required due to pt. Tendency to speed up when feeling unstable. Patient will continue to benefit from skilled physical therapy to improve  balance, strength, and gait safety.    Rehab Potential Good   PT Frequency 2x / week   PT Duration 12 weeks   PT Treatment/Interventions Aquatic Therapy;Cryotherapy;Moist Heat;Gait training;Stair training;Functional  mobility training;Neuromuscular re-education;Balance training;Therapeutic exercise;Therapeutic activities;Patient/family education;Manual techniques   PT Next Visit Plan dynamic balance training - focus on single limb stability and varying BOS   PT Home Exercise Plan continue as given;    Consulted and Agree with Plan of Care Patient;Family member/caregiver      Patient will benefit from skilled therapeutic intervention in order to improve the following deficits and impairments:  Decreased balance, Decreased endurance, Decreased mobility, Difficulty walking, Dizziness, Decreased activity tolerance, Decreased strength, Abnormal gait  Visit Diagnosis: Muscle weakness (generalized)  Difficulty in walking, not elsewhere classified  Other lack of coordination     Problem List Patient Active Problem List   Diagnosis Date Noted  . Special screening for malignant neoplasms, colon 07/02/2016   Janna Arch, PT, DPT   Janna Arch 08/24/2017, 12:03 PM  [place] MAIN [facility] SERVICES [address] [place], [place], [address] Phone: [phone]   Fax:  [phone]  Name: RAIYAN DALESANDRO MRN: [id number] Date of Birth: January 21, 1946

## 2017-08-26 ENCOUNTER — Encounter: Payer: Medicare Other | Admitting: Occupational Therapy

## 2017-08-26 ENCOUNTER — Ambulatory Visit: Payer: Medicare Other | Admitting: Physical Therapy

## 2017-08-26 ENCOUNTER — Encounter: Payer: Medicare Other | Admitting: Speech Pathology

## 2017-08-31 ENCOUNTER — Encounter: Payer: Medicare Other | Admitting: Speech Pathology

## 2017-08-31 ENCOUNTER — Encounter: Payer: Medicare Other | Admitting: Occupational Therapy

## 2017-09-02 ENCOUNTER — Ambulatory Visit: Payer: Medicare Other | Admitting: Physical Therapy

## 2017-09-02 ENCOUNTER — Encounter: Payer: Medicare Other | Admitting: Speech Pathology

## 2017-09-02 ENCOUNTER — Encounter: Payer: Medicare Other | Admitting: Occupational Therapy

## 2017-09-07 ENCOUNTER — Encounter: Payer: Self-pay | Admitting: Physical Therapy

## 2017-09-07 ENCOUNTER — Encounter: Payer: Medicare Other | Admitting: Occupational Therapy

## 2017-09-07 ENCOUNTER — Encounter: Payer: Medicare Other | Admitting: Speech Pathology

## 2017-09-07 ENCOUNTER — Ambulatory Visit: Payer: Medicare Other | Attending: Family Medicine | Admitting: Physical Therapy

## 2017-09-07 DIAGNOSIS — R278 Other lack of coordination: Secondary | ICD-10-CM | POA: Insufficient documentation

## 2017-09-07 DIAGNOSIS — M6281 Muscle weakness (generalized): Secondary | ICD-10-CM | POA: Diagnosis not present

## 2017-09-07 DIAGNOSIS — Z1211 Encounter for screening for malignant neoplasm of colon: Secondary | ICD-10-CM | POA: Insufficient documentation

## 2017-09-07 DIAGNOSIS — R262 Difficulty in walking, not elsewhere classified: Secondary | ICD-10-CM | POA: Diagnosis present

## 2017-09-07 DIAGNOSIS — R41841 Cognitive communication deficit: Secondary | ICD-10-CM | POA: Diagnosis present

## 2017-09-07 NOTE — Therapy (Signed)
[place] MAIN [facility] SERVICES [address] [place], [place], [address] Phone: [phone]   Fax:  [phone]  Physical Therapy Treatment  Patient Details  Name: Tyrone Fox MRN: [id number] Date of Birth: 08-May-1946 Referring Provider: [name]   Encounter Date: 09/07/2017  PT End of Session - 09/07/17 0914    Visit Number  13    Number of Visits  25    Date for PT Re-Evaluation  10/08/17    Authorization Type  3/10 g codes    PT Start Time  0905    PT Stop Time  0945    PT Time Calculation (min)  40 min    Equipment Utilized During Treatment  Gait belt    Activity Tolerance  Patient tolerated treatment well;Patient limited by fatigue    Behavior During Therapy  [facility] for tasks assessed/performed       History reviewed. No pertinent past medical history.  Past Surgical History:  Procedure Laterality Date  . CHOLECYSTECTOMY    . TONSILLECTOMY      There were no vitals filed for this visit.  Subjective Assessment - 09/07/17 0912    Subjective  Patient continues to have symptoms of dizziness. He has not had any falls, no new concerns. He feels that he has made some gains in therapy.     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 He was there 24 hours. Then he stayed at his daughter for 3 weeks. He went to out patient therapy 1 time and it was decided that her should wait unitl he flew back home to have the therapy. He got home from [place] stated spet 5th.     Limitations  Standing;Walking    How long can you sit comfortably?  unlimited    How long can you stand comfortably?  he gets tired but at least 30 mins    How long can you walk comfortably?  intermediate distances with spc    Patient Stated Goals  Patient wants to be able to walk long distances, have more energy, stay awake.    Currently in  Pain?  No/denies    Multiple Pain Sites  No       TREATMENT  Therapeutic Exercise:  Sit<stand without use of UEs x10  Toe taps to 6 in step x 30 B LE standing on foam pad with LOB x1  Tandem walking x8 lengths in // bars requiring intermittent UE support due to LOB  SLS: 2x30 seconds each LE, requiring intermittent UE support for LOB. Up to 8 second on RLE, up to 18 seconds on the LLE.  Lateral stepping over cone x10 each direction without UE support  Agility ladder two steps forward into square, two steps back. X4 lengths  Agility ladder lateral stepping two steps in each square. X4 lengths  BLE leg press 105# 2x20, 120# x10  Single leg LLE leg press 75# 2x15 ROCKER BOARD FWD/BWD,  Side to side lateral shifts x 2 mins Foam standing with toe taps diagonally x 20 CGA and Min to mod verbal cues used throughout with increased in postural sway and LOB most seen with narrow base of support and while on uneven surfaces. Continues to have balance deficits typical with diagnosis. Patient is somewhat impulsive and needs cues to slow down his movement.  PT Education - 09/07/17 0913    Education Details  safety with steps    Person(s) Educated  Patient    Methods  Explanation;Demonstration    Comprehension  Verbalized understanding;Returned demonstration       PT Short Term Goals - 08/17/17 1553      PT SHORT TERM GOAL #1   Title  Patient will reduce timed up and go to <13 seconds to reduce fall risk and demonstrate improved transfer/gait ability.    Baseline  15.88 sec; 10/15: 7.14 seconds    Time  6    Period  Weeks    Status  Achieved      PT SHORT TERM GOAL #2   Title  Patient (> 63 years old) will complete five times sit to stand test in < 18 seconds indicating an increased LE strength and improved balance.    Baseline  20.39 sec; 10/15: 13.03 seconds but pt pushes from thighs with BUEs    Time  6    Period  Weeks    Status  Partially Met       PT SHORT TERM GOAL #3   Title  Patient will ambulate without spc indoors to be able to ambulate household distances and carry items.     Baseline  Patient ambulates wiht spc; 10/15: pt does not use AD    Time  6    Period  Weeks    Status  Achieved        PT Long Term Goals - 07/16/17 1532      PT LONG TERM GOAL #1   Title  Patient will be independent in home exercise program to improve strength/mobility for better functional independence with ADLs.    Time  12    Period  Weeks    Status  New    Target Date  10/08/17      PT LONG TERM GOAL #2   Title  Patient (> 62 years old) will complete five times sit to stand test in < 15 seconds indicating an increased LE strength and improved balance.    Baseline  20.39 sec    Time  12    Period  Weeks    Status  New    Target Date  10/08/17      PT LONG TERM GOAL #3   Title  Patient will reduce timed up and go to <11 seconds to reduce fall risk and demonstrate improved transfer/gait ability.    Baseline  15.88 sec    Time  12    Period  Weeks    Status  New    Target Date  10/08/17      PT LONG TERM GOAL #4   Title  Patient will ambulate wihtout AD for outside distances and uneven surfaces to be able to play golf.     Baseline  Patient uses spc and ambulates intermediate distances.    Time  12    Period  Weeks    Status  New    Target Date  10/08/17            Plan - 09/07/17 0914    Clinical Impression Statement  Dynamic and static balance interventions continued today, with a focus on unilateral LE stability..  Pt was encouraged to perform HEP during the week in order to continue progressing balance and strength interventions.  Pt would continue to benefit from skilled therapy services in order to further address LE strength deficits and balance deficits  in order to decrease fall risk and improve mobility.    Rehab Potential  Good    PT Frequency  2x / week    PT Duration  12 weeks    PT Treatment/Interventions   Aquatic Therapy;Cryotherapy;Moist Heat;Gait training;Stair training;Functional mobility training;Neuromuscular re-education;Balance training;Therapeutic exercise;Therapeutic activities;Patient/family education;Manual techniques    PT Next Visit Plan  dynamic balance training - focus on single limb stability and varying BOS    PT Home Exercise Plan  continue as given;     Consulted and Agree with Plan of Care  Patient;Family member/caregiver       Patient will benefit from skilled therapeutic intervention in order to improve the following deficits and impairments:  Decreased balance, Decreased endurance, Decreased mobility, Difficulty walking, Dizziness, Decreased activity tolerance, Decreased strength, Abnormal gait  Visit Diagnosis: Muscle weakness (generalized)  Difficulty in walking, not elsewhere classified  Other lack of coordination  Cognitive communication deficit     Problem List Patient Active Problem List   Diagnosis Date Noted  . Special screening for malignant neoplasms, colon 07/02/2016    Alanson Puls , PT DPT 09/07/2017, 9:24 AM  [place] MAIN [facility] SERVICES [address] [place], [place], [address] Phone: [phone]   Fax:  [phone]  Name: DEKARI BURES MRN: [id number] Date of Birth: 1946-01-11

## 2017-09-09 ENCOUNTER — Encounter: Payer: Medicare Other | Admitting: Occupational Therapy

## 2017-09-09 ENCOUNTER — Encounter: Payer: Self-pay | Admitting: Physical Therapy

## 2017-09-09 ENCOUNTER — Ambulatory Visit: Payer: Medicare Other | Admitting: Physical Therapy

## 2017-09-09 ENCOUNTER — Encounter: Payer: Medicare Other | Admitting: Speech Pathology

## 2017-09-09 DIAGNOSIS — R41841 Cognitive communication deficit: Secondary | ICD-10-CM

## 2017-09-09 DIAGNOSIS — Z1211 Encounter for screening for malignant neoplasm of colon: Secondary | ICD-10-CM

## 2017-09-09 DIAGNOSIS — M6281 Muscle weakness (generalized): Secondary | ICD-10-CM | POA: Diagnosis not present

## 2017-09-09 DIAGNOSIS — R278 Other lack of coordination: Secondary | ICD-10-CM

## 2017-09-09 DIAGNOSIS — R262 Difficulty in walking, not elsewhere classified: Secondary | ICD-10-CM

## 2017-09-09 NOTE — Therapy (Signed)
[place] MAIN [facility] SERVICES [address] [place], [place], [address] Phone: [phone]   Fax:  [phone]  Physical Therapy Treatment/ Discharge Summary  Patient Details  Name: Tyrone Fox MRN: [id number] Date of Birth: December 30, 1945 Referring Provider: [name]   Encounter Date: 09/09/2017  PT End of Session - 09/09/17 0907    Visit Number  14    Number of Visits  25    Date for PT Re-Evaluation  10/08/17    Authorization Type  4/10 g codes    PT Start Time  0905    PT Stop Time  0945    PT Time Calculation (min)  40 min    Equipment Utilized During Treatment  Gait belt    Activity Tolerance  Patient tolerated treatment well;Patient limited by fatigue    Behavior During Therapy  [facility] for tasks assessed/performed       History reviewed. No pertinent past medical history.  Past Surgical History:  Procedure Laterality Date  . CHOLECYSTECTOMY    . TONSILLECTOMY      There were no vitals filed for this visit.  Subjective Assessment - 09/09/17 0907    Subjective  Patient continues to have symptoms of dizziness. He has not had any falls, no new concerns. He feels that he has made some gains in therapy.     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 He was there 24 hours. Then he stayed at his daughter for 3 weeks. He went to out patient therapy 1 time and it was decided that her should wait unitl he flew back home to have the therapy. He got home from [place] stated spet 5th.     Limitations  Standing;Walking    How long can you sit comfortably?  unlimited    How long can you stand comfortably?  he gets tired but at least 30 mins    How long can you walk comfortably?  intermediate distances with spc    Patient Stated Goals  Patient wants to be able to walk long distances, have more energy, stay  awake.    Currently in Pain?  No/denies    Multiple Pain Sites  No       Treatment:             Octane Fittness x 5 min               Toe taps to 6 in step x 20 B LE standing on foam pad                           Fwd and reverse steps from foam pads over 1/2 foam roller x 20 reps with SBA for safety - 2 LOB noted with ability to self-correct              Lateral walks on 1/2 foam x 3 laps with CGA for safety                            UE flexion with 1 lb rod standing on foam pad x 20 reps               Ball toss in regular BOS x 45 tosses - no LOB noted;  ball thrown at different heights and angles to challenge balance              Ball side to side with elbows straight in narrow stance x 45 tosses - improved postural reactions noted              Lunges into BOSU  x 20 B LE's - visual demonstration for proper technique              Rockerboard fwd and backward x 20 reps without UE support               Rockerboard balance: fwd/backward x 30 sec; lateral x 30 sec - decreased stability with lateral direction    Outcome measures performed and goals assessed with improvements in 5 x sit to stand goal and TUG goal . Patients 10 MW was 1.54 m/sec                     PT Education - 09/09/17 0907    Education provided  Yes    Education Details  HEP    Person(s) Educated  Patient    Methods  Explanation;Demonstration;Verbal cues    Comprehension  Verbalized understanding;Returned demonstration       PT Short Term Goals - 09/09/17 0910      PT SHORT TERM GOAL #1   Title  Patient will reduce timed up and go to <13 seconds to reduce fall risk and demonstrate improved transfer/gait ability.    Baseline  15.88 sec; 10/15: 7.14 seconds    Time  6    Period  Weeks    Status  Achieved      PT SHORT TERM GOAL #2   Title  Patient (> 72 years old) will complete five times sit to stand test in < 18 seconds indicating an increased LE strength and improved  balance.    Baseline  20.39 sec; 10/15: 13.03 seconds but pt pushes from thighs with BUEs,11.50    Period  Weeks    Status  Achieved    Target Date  08/27/17      PT SHORT TERM GOAL #3   Title  Patient will ambulate without spc indoors to be able to ambulate household distances and carry items.     Baseline  Patient ambulates wiht spc; 10/15: pt does not use AD    Time  6    Period  Weeks    Status  Achieved        PT Long Term Goals - 09/09/17 0913      PT LONG TERM GOAL #1   Title  Patient will be independent in home exercise program to improve strength/mobility for better functional independence with ADLs.  (Pended)     Period  Weeks  (Pended)     Status  Achieved  (Pended)       PT LONG TERM GOAL #2   Title  Patient (> 72 years old) will complete five times sit to stand test in < 15 seconds indicating an increased LE strength and improved balance.  (Pended)     Baseline  20.39 sec, 11.50 sec  (Pended)     Time  12  (Pended)     Period  Weeks  (Pended)       PT LONG TERM GOAL #3   Title  Patient will reduce timed up and go to <11 seconds to reduce fall risk and demonstrate improved transfer/gait ability.  (Pended)  Baseline  15.88 sec, 6.54 sec  (Pended)     Time  12  (Pended)     Period  Weeks  (Pended)     Status  Achieved  (Pended)       PT LONG TERM GOAL #4   Title  Patient will ambulate wihtout AD for outside distances and uneven surfaces to be able to play golf.   (Pended)     Baseline  Patient does not use AD ambulates intermediate distances.  (Pended)     Time  12  (Pended)     Period  Weeks  (Pended)     Status  Achieved  (Pended)             Plan - October 01, 2017 0908    Clinical Impression Statement  Patient performed outcome measures to assess the goals . He is being discharged today to HEP. He is going to the gym daily for exercises. He performed dynamic standing balance training with CGA for all activities and loss of balance with higher level and  single leg balance activities. Patient has reached goals and will be discharged today.     Rehab Potential  Good    PT Frequency  2x / week    PT Duration  12 weeks    PT Treatment/Interventions  Aquatic Therapy;Cryotherapy;Moist Heat;Gait training;Stair training;Functional mobility training;Neuromuscular re-education;Balance training;Therapeutic exercise;Therapeutic activities;Patient/family education;Manual techniques    PT Next Visit Plan  dynamic balance training - focus on single limb stability and varying BOS    PT Home Exercise Plan  continue as given;     Consulted and Agree with Plan of Care  Patient;Family member/caregiver       Patient will benefit from skilled therapeutic intervention in order to improve the following deficits and impairments:  Decreased balance, Decreased endurance, Decreased mobility, Difficulty walking, Dizziness, Decreased activity tolerance, Decreased strength, Abnormal gait  Visit Diagnosis: Muscle weakness (generalized)  Special screening for malignant neoplasms, colon  Difficulty in walking, not elsewhere classified  Other lack of coordination  Cognitive communication deficit   G-Codes - 10-01-17 0938    Functional Assessment Tool Used (Outpatient Only)  TUG, 5xSTS, clinical judgement    Functional Limitation  Mobility: Walking and moving around    Mobility: Walking and Moving Around Current Status [phone])  At least 1 percent but less than 20 percent impaired, limited or restricted    Mobility: Walking and Moving Around Goal Status [phone])  At least 1 percent but less than 20 percent impaired, limited or restricted    Mobility: Walking and Moving Around Discharge Status [phone])  At least 1 percent but less than 20 percent impaired, limited or restricted       Problem List Patient Active Problem List   Diagnosis Date Noted  . Special screening for malignant neoplasms, colon 07/02/2016    Alanson Puls, PT DPT 10-01-17, 9:46  AM  [place] MAIN [facility] SERVICES [address] [place], [place], [address] Phone: [phone]   Fax:  [phone]  Name: JUSITN SALSGIVER MRN: [id number] Date of Birth: 1946/10/17

## 2017-09-21 ENCOUNTER — Encounter: Payer: Self-pay | Admitting: Emergency Medicine

## 2017-09-21 ENCOUNTER — Emergency Department
Admission: EM | Admit: 2017-09-21 | Discharge: 2017-09-21 | Disposition: A | Discharge status: Home / Self Care | Payer: Medicare Other | Attending: Emergency Medicine | Admitting: Emergency Medicine

## 2017-09-21 DIAGNOSIS — Z7901 Long term (current) use of anticoagulants: Secondary | ICD-10-CM | POA: Diagnosis not present

## 2017-09-21 DIAGNOSIS — Z8673 Personal history of transient ischemic attack (TIA), and cerebral infarction without residual deficits: Secondary | ICD-10-CM | POA: Diagnosis not present

## 2017-09-21 DIAGNOSIS — R319 Hematuria, unspecified: Secondary | ICD-10-CM | POA: Diagnosis not present

## 2017-09-21 DIAGNOSIS — Z79899 Other long term (current) drug therapy: Secondary | ICD-10-CM | POA: Insufficient documentation

## 2017-09-21 DIAGNOSIS — I10 Essential (primary) hypertension: Secondary | ICD-10-CM | POA: Insufficient documentation

## 2017-09-21 HISTORY — DX: Cerebral infarction, unspecified: I63.9

## 2017-09-21 HISTORY — DX: Essential (primary) hypertension: I10

## 2017-09-21 LAB — URINALYSIS, COMPLETE (UACMP) WITH MICROSCOPIC
Bacteria, UA: NONE SEEN
Bilirubin Urine: NEGATIVE
Glucose, UA: NEGATIVE mg/dL
KETONES UR: NEGATIVE mg/dL
Leukocytes, UA: NEGATIVE
Nitrite: NEGATIVE
PH: 5 (ref 5.0–8.0)
Protein, ur: NEGATIVE mg/dL
SQUAMOUS EPITHELIAL / LPF: NONE SEEN
Specific Gravity, Urine: 1.017 (ref 1.005–1.030)

## 2017-09-21 MED ORDER — LIDOCAINE HCL (PF) 1 % IJ SOLN
INTRAMUSCULAR | Status: AC
  Filled 2017-09-21: qty 5

## 2017-09-21 NOTE — ED Triage Notes (Signed)
Pt in via POV with complaints of hematuria x one day, pt reports having "bladder test" done today at urologists office, since then passing small clots in urine, and more bleeding occurring after urination.  Vitals WDL, NAD noted at this time.

## 2017-09-21 NOTE — ED Provider Notes (Signed)
[facility] Emergency Department Provider Note  Time seen: 5:34 PM  I have reviewed the triage vital signs and the nursing notes.   HISTORY  Chief Complaint Hematuria    HPI Tyrone Fox is a 71 y.o. male with a past medical history of hypertension, CVA on Xarelto, presents to the emergency department for hematuria.  According to the patient and record review the patient had urodynamic study performed today by his urologist Dr. Rosana Hoes.  Patient states the testing involved being catheterized twice.  Patient states during 1 of the catheterizations the nurse states that she might of hit a stricture per patient.  Patient noticed some mild bleeding after he urinated but was told this was to be expected.  He states however after going home he began having blood dripping from his penis.  Patient states when he urinates he has bloody urine but then his penis continues to drip blood after urinating.  States the bleeding is slowed somewhat, but continues in the emergency department.  He called his urologist who recommended he go to the emergency department for evaluation.  Patient denies any pain.  Past Medical History:  Diagnosis Date  . Hypertension   . Stroke [facility])     Patient Active Problem List   Diagnosis Date Noted  . Special screening for malignant neoplasms, colon 07/02/2016    Past Surgical History:  Procedure Laterality Date  . CHOLECYSTECTOMY    . COLONOSCOPY N/A 03/04/2017   Performed by Rogene Houston, MD at [place]  . FLEXIBLE BRONCHOSCOPY N/A 02/05/2017   Performed by [name], MD at [facility] ORS  . TONSILLECTOMY      Prior to Admission medications   Medication Sig Start Date End Date Taking? Authorizing Provider  dextromethorphan-guaiFENesin (MUCINEX DM) 30-600 MG 12hr tablet Take 1 tablet by mouth 2 (two) times daily.    [provider]  esomeprazole (NEXIUM) 20 MG capsule Take 40 mg by mouth daily.     [provider]  fluticasone (FLONASE) 50 MCG/ACT nasal spray Place 2 sprays into both nostrils at bedtime. Patient taking differently: Place 2 sprays into both nostrils daily as needed for allergies.  01/08/17   [name], MD  loratadine (CLARITIN) 10 MG tablet Take 10 mg by mouth daily as needed for allergies.    [provider]  Rivaroxaban (XARELTO) 15 MG TABS tablet Take 15 mg by mouth 2 (two) times daily with a meal. Take 15 mg by mouth twice daily with meals for 21 days. 06/08/17 06/29/17  [provider]  tamsulosin (FLOMAX) 0.4 MG CAPS capsule Take 0.4 mg by mouth 2 (two) times daily.     [provider]  VESICARE 5 MG tablet Take 5 mg by mouth daily. 10/22/16   [provider]    Allergies  Allergen Reactions  . Penicillins Hives and Itching    Has patient had a PCN reaction causing immediate rash, facial/tongue/throat swelling, SOB or lightheadedness with hypotension: {yes Has patient had a PCN reaction causing severe rash involving mucus membranes or skin necrosis: {Yno Has patient had a PCN reaction that required hospitalization no Has patient had a PCN reaction occurring within the last 10 years: {yes If all of the above answers are "NO", then may proceed with Cephalosporin use.    Family History  Problem Relation Age of Onset  . Heart disease Mother   . Hyperlipidemia Mother   . Heart disease Father   . Heart disease Maternal  Grandmother   . Heart attack Maternal Grandmother     Social History Social History   Tobacco Use  . Smoking status: Never Smoker  . Smokeless tobacco: Never Used  Substance Use Topics  . Alcohol use: No  . Drug use: No    Review of Systems Constitutional: Negative for fever Cardiovascular: Negative for chest pain. Respiratory: Negative for shortness of breath. Gastrointestinal: Negative for abdominal pain Genitourinary: Positive for hematuria, positive for bleeding from his penis.  Denies  dysuria. All other ROS negative  ____________________________________________   PHYSICAL EXAM:  VITAL SIGNS: ED Triage Vitals  Enc Vitals Group     BP 09/21/17 1609 124/81     Pulse Rate 09/21/17 1609 69     Resp 09/21/17 1609 16     Temp 09/21/17 1609 98.6 F (37 C)     Temp Source 09/21/17 1609 Oral     SpO2 09/21/17 1609 94 %     Weight 09/21/17 1610 192 lb (87.1 kg)     Height 09/21/17 1610 _0  (1.702 m)     Head Circumference --      Peak Flow --      Pain Score 09/21/17 1659 0     Pain Loc --      Pain Edu? --      Excl. in [place]? --     Constitutional: Alert and oriented. Well appearing and in no distress. Eyes: Normal exam ENT   Head: Normocephalic and atraumatic.   Mouth/Throat: Mucous membranes are moist. Cardiovascular: Normal rate, regular rhythm. No murmur Respiratory: Normal respiratory effort without tachypnea nor retractions. Breath sounds are clear Gastrointestinal: Soft and nontender. No distention.   Genitourinary: Patient is wearing a pad with a small amount of bright red blood on the pad.  Patient has normal-appearing scrotum and testicles, normal-appearing penis however he does have a slight amount of blood from the meatus when the penis was palpated. Musculoskeletal: normal range of motion in all extremities. Neurologic:  Normal speech and language. No gross focal neurologic deficits, ambulates without difficulty. Skin:  Skin is warm, dry  Psychiatric: Mood and affect are normal.  ____________________________________________    INITIAL IMPRESSION / ASSESSMENT AND PLAN / ED COURSE  Pertinent labs & imaging results that were available during my care of the patient were reviewed by me and considered in my medical decision making (see chart for details).  Patient presents to the emergency department for hematuria after urology testing today.  Differential would include a traumatic catheterization, prostate injury, urethral injury, bladder  wall injury.  Overall the patient appears extremely well with normal-appearing vitals, urinalysis shows too numerous to count red blood cells but otherwise appears well/normal.  We will attempt to discuss the patient further with his urologist Dr. Rosana Hoes.  If we are unable to reach Dr. Rosana Hoes we will consult our local urologist for further recommendations.  Patient does not appear to have a significant amount of bleeding.  Patient was able to urinate in the emergency department I asked the patient not to flush so I could visualize the sample.  Patient does have pink tinged urine and then has a mild to moderate amount of bleeding after he urinates dripping from the penis, which is bright red in color.  Post void bladder scan shows 99 mL after urinating.  Patient states he has residual urine after urinating at baseline which is why he had the study performed today.  Patient denies any inability to urinate.  Was able to  reach Dr. Judithann Sheen of [facility] urology.  He recommends discontinuing Xarelto for 48 hours and having the patient follow-up with them as soon as possible.  I discussed with him that the patient is going out of town tomorrow, he states the main concern would be if he becomes obstructed and is retaining urine if so he should go to the closest emergency department for a catheter and leg bag.  I discussed this with the patient and he is agreeable to this plan.  As the patient is still able to urinate without any retention issues we will discharged without a catheter in place.  Patient agreeable.  ____________________________________________   FINAL CLINICAL IMPRESSION(S) / ED DIAGNOSES  Hematuria    Harvest Dark, MD 09/21/17 1930

## 2017-09-21 NOTE — ED Notes (Signed)
Bladder scan performed by this RN, 99cc's in patient's bladder. MD aware.

## 2018-01-26 ENCOUNTER — Emergency Department
Admission: EM | Admit: 2018-01-26 | Discharge: 2018-01-26 | Disposition: A | Discharge status: Home / Self Care | Payer: Medicare Other | Attending: Emergency Medicine | Admitting: Emergency Medicine

## 2018-01-26 ENCOUNTER — Encounter: Payer: Self-pay | Admitting: Emergency Medicine

## 2018-01-26 ENCOUNTER — Other Ambulatory Visit: Payer: Self-pay

## 2018-01-26 ENCOUNTER — Emergency Department: Payer: Medicare Other

## 2018-01-26 DIAGNOSIS — Z8673 Personal history of transient ischemic attack (TIA), and cerebral infarction without residual deficits: Secondary | ICD-10-CM | POA: Diagnosis not present

## 2018-01-26 DIAGNOSIS — I10 Essential (primary) hypertension: Secondary | ICD-10-CM | POA: Insufficient documentation

## 2018-01-26 DIAGNOSIS — H538 Other visual disturbances: Secondary | ICD-10-CM

## 2018-01-26 DIAGNOSIS — Z79899 Other long term (current) drug therapy: Secondary | ICD-10-CM | POA: Insufficient documentation

## 2018-01-26 DIAGNOSIS — Z9049 Acquired absence of other specified parts of digestive tract: Secondary | ICD-10-CM | POA: Diagnosis not present

## 2018-01-26 DIAGNOSIS — Z7982 Long term (current) use of aspirin: Secondary | ICD-10-CM | POA: Diagnosis not present

## 2018-01-26 LAB — GLUCOSE, CAPILLARY: GLUCOSE-CAPILLARY: 198 mg/dL — AB (ref 65–99)

## 2018-01-26 IMAGING — CT CT HEAD W/O CM
3 series · 16 of 47 positions shown, 19 images · non-contrast
Comparison: None.

CLINICAL DATA: Onset of weakness, slurred speech and facial droop
yesterday while working the patient was working in his yard.

EXAM:
CT HEAD WITHOUT CONTRAST
TECHNIQUE: Contiguous axial images were obtained from the base of the skull
through the vertex without intravenous contrast.

[Series 3: head wo · axial · 0.42mm/px · z∈[-135,-5]mm · 10 of 32 slices shown, 13 images]
[im 3/32  brain]
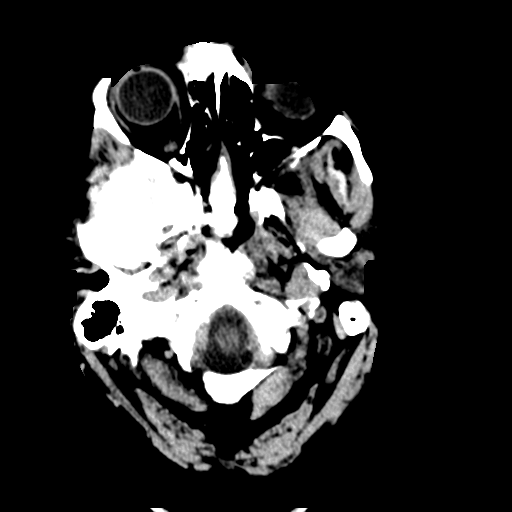
[im 3/32  bone]
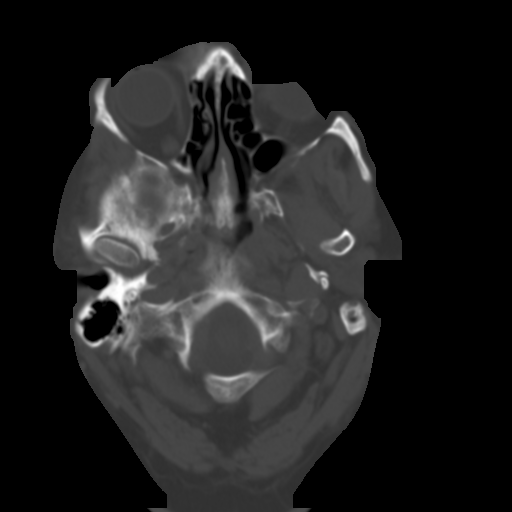
[im 6/32  brain]
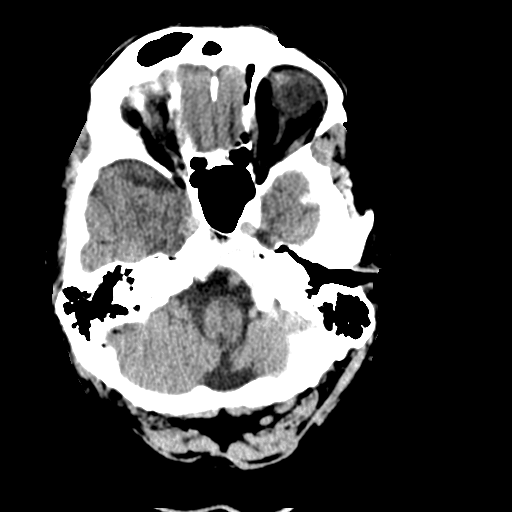
[im 9/32  brain]
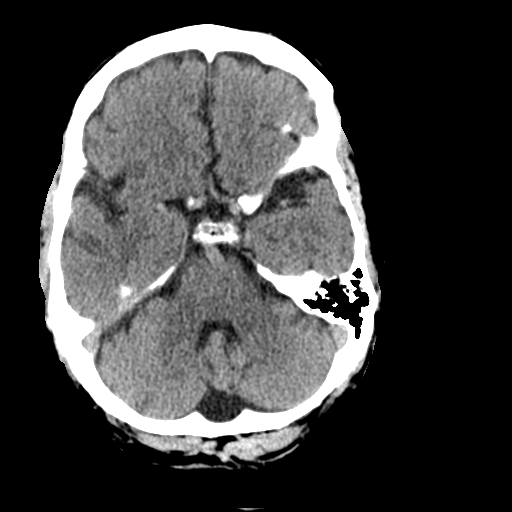
[im 11/32  brain]
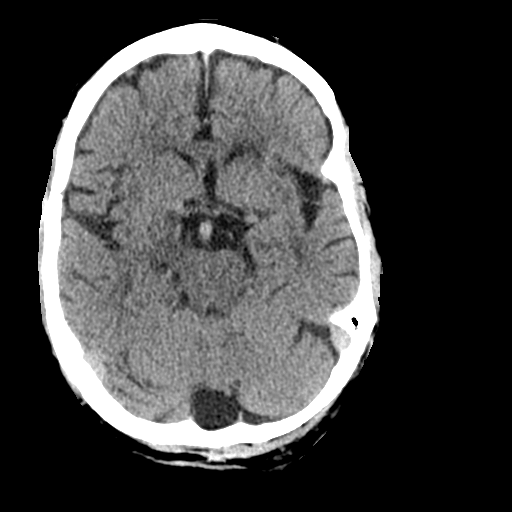
[im 14/32  brain]
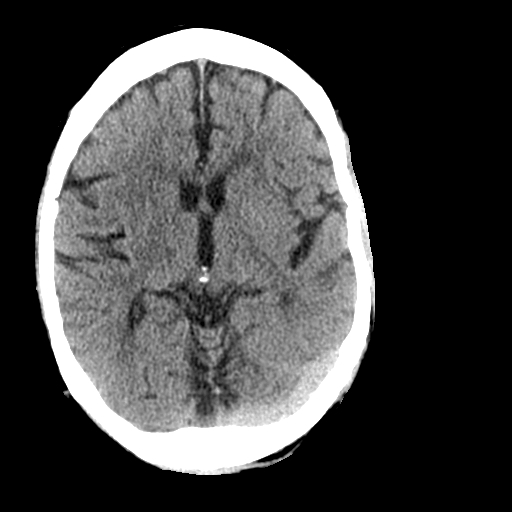
[im 14/32  bone]
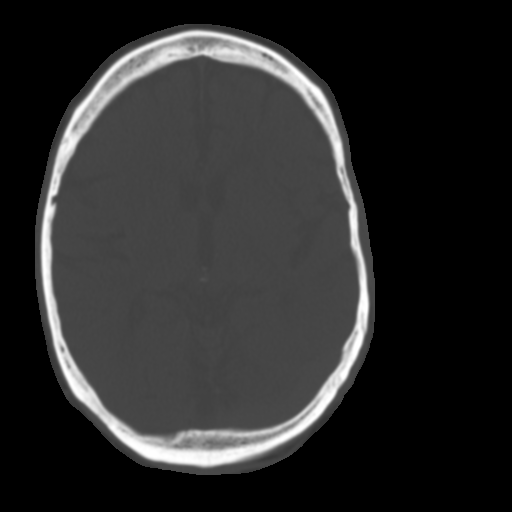
[im 18/32  brain]
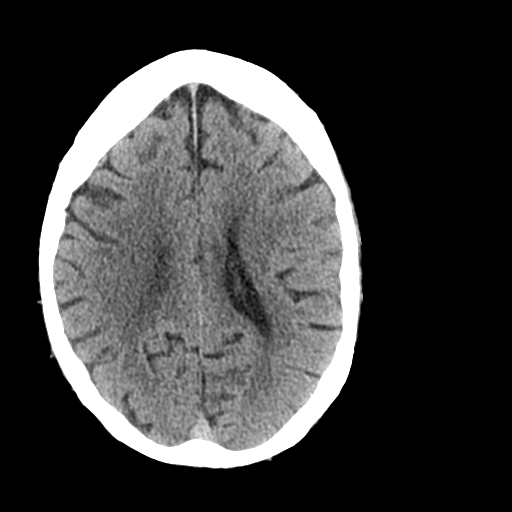
[im 21/32  brain]
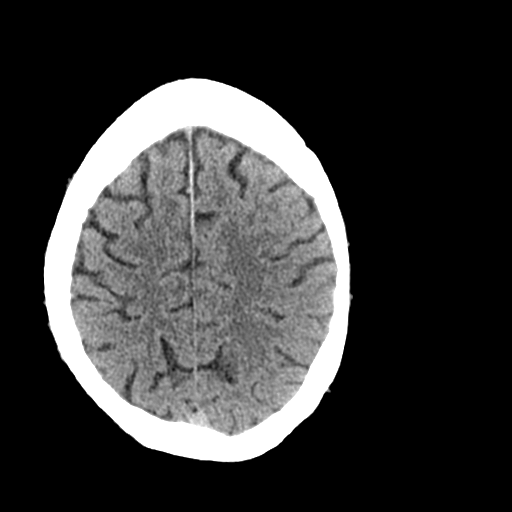
[im 24/32  brain]
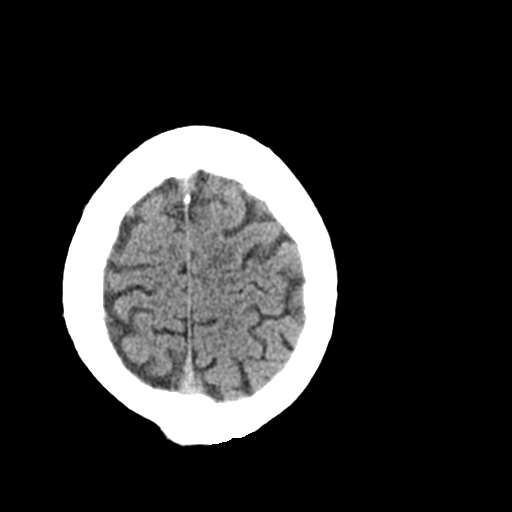
[im 26/32  brain]
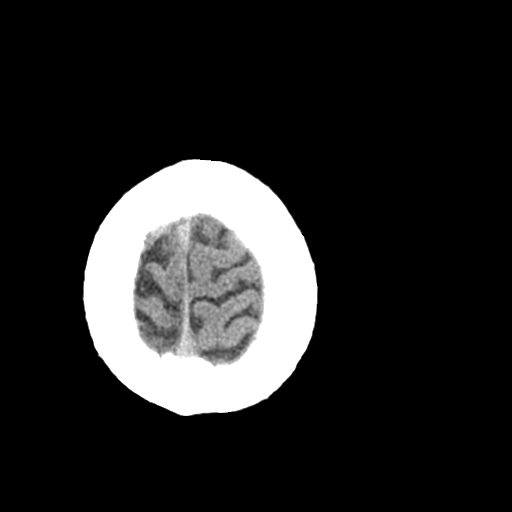
[im 26/32  bone]
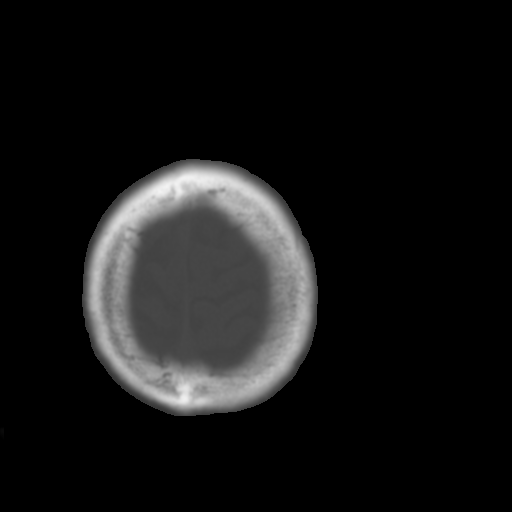
[im 29/32  brain]
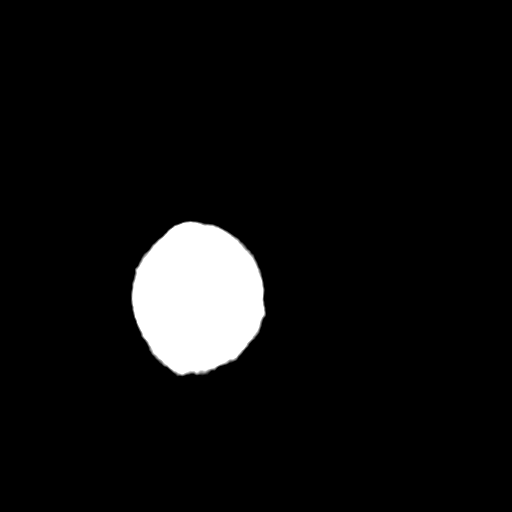

[Series 4: coronal soft tissue · coronal · 0.31mm/px · 3 of 71 slices shown]
[im 24/71  brain]
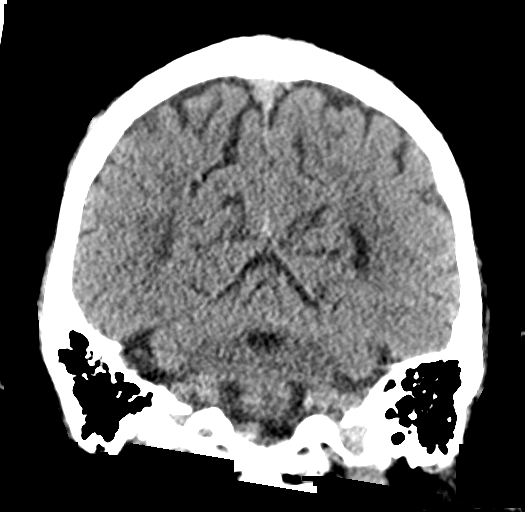
[im 32/71  brain]
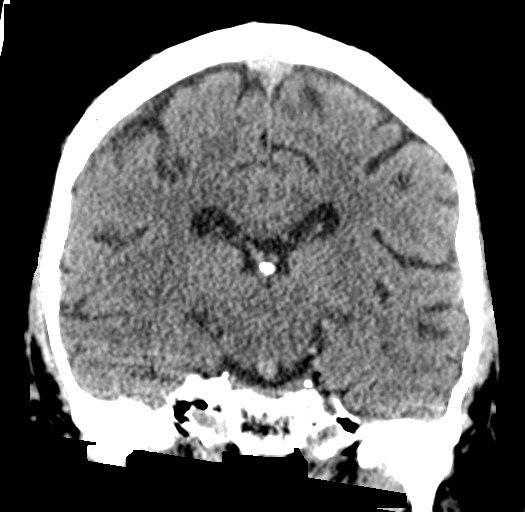
[im 39/71  brain]
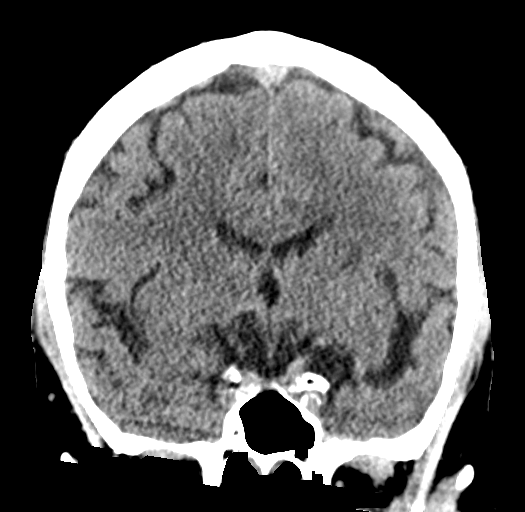

[Series 6: sag ... soft tissue · sagittal · 0.31mm/px · 3 of 55 slices shown]
[im 21/55  brain]
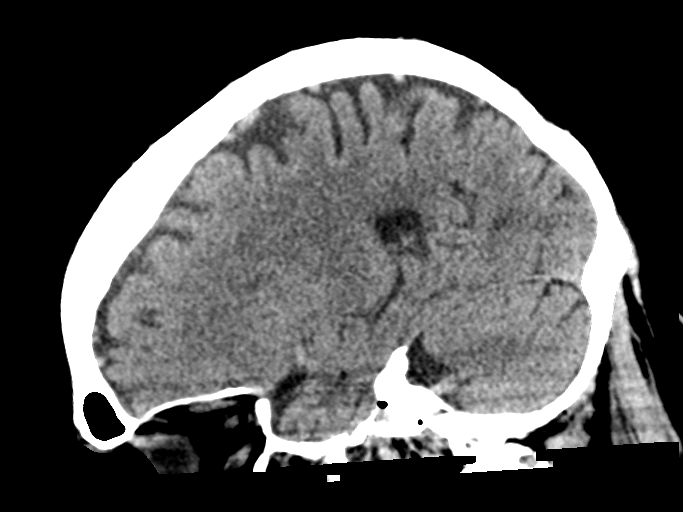
[im 28/55  brain]
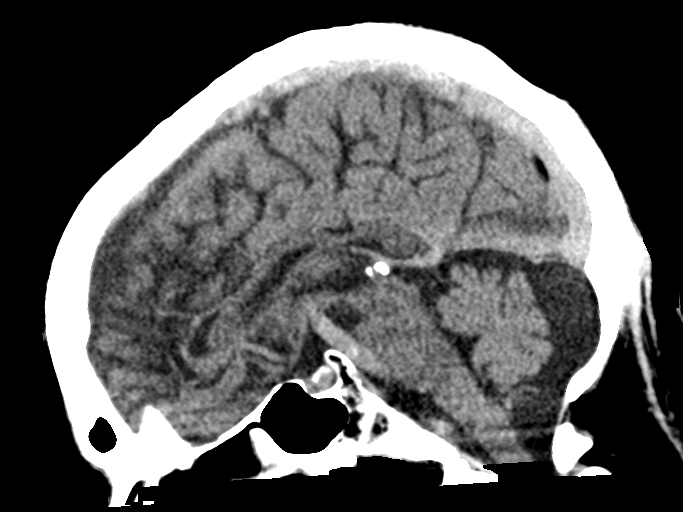
[im 34/55  brain]
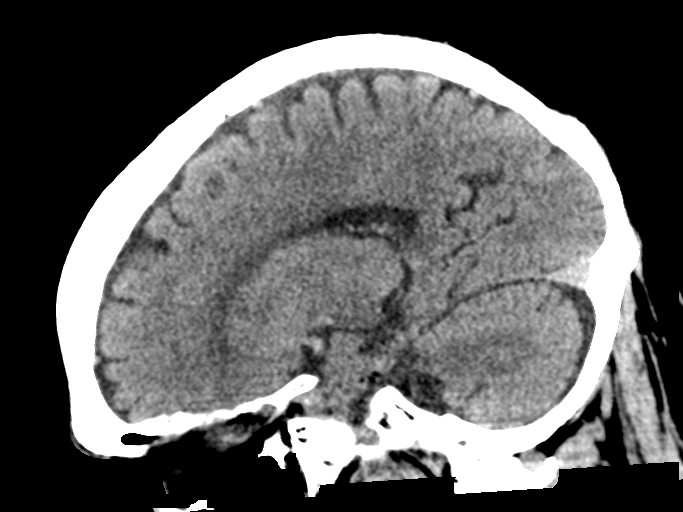

[16 of 47 positions shown; findings below may reference images not displayed]

FINDINGS: Brain: There is some chronic microvascular ischemic change. No
evidence of acute intracranial abnormality including hemorrhage,
infarct, mass lesion, mass effect, midline shift or abnormal
extra-axial fluid collection.

Vascular: No hyperdense vessel or unexpected calcification.

Skull: Intact.

Sinuses/Orbits: Negative.

Other: None.
IMPRESSION: No acute abnormality.

Mild chronic microvascular ischemic change.

## 2018-01-26 MED ORDER — TETRACAINE HCL 0.5 % OP SOLN
2.0000 [drp] | Freq: Once | OPHTHALMIC | Status: AC
  Administered 2018-01-26: 2 [drp] via OPHTHALMIC
  Filled 2018-01-26: qty 4

## 2018-01-26 NOTE — ED Notes (Signed)
NAD noted at time of D/C. Pt denies questions or concerns. Pt ambulatory to the lobby at this time.  

## 2018-01-26 NOTE — ED Triage Notes (Signed)
Blurred vision in right eye for 4 days.  Has been being treated for vertigo at [facility] recently.  Has a history of stroke with left side weakness.

## 2018-01-26 NOTE — Discharge Instructions (Addendum)
Follow-up at [facility] tomorrow at 8 AM for your scheduled appointment.  Return to the emergency room if you have pain in your eye, visual loss partial or full, headache, or any signs of stroke such as facial droop, slurred speech, unilateral weakness or numbness, difficulty finding words.

## 2018-01-26 NOTE — ED Notes (Signed)
FIRST NURSE NOTE:  Pt to ed with c/o right eye blurred vision x 4 days.  States hx of CVA several years ago.  No facial drooping noted, no slurred speech noted. No unilateral weakness noted.

## 2018-01-26 NOTE — ED Provider Notes (Signed)
[facility] Emergency Department Provider Note  ____________________________________________  Time seen: Approximately 5:42 PM  I have reviewed the triage vital signs and the nursing notes.   HISTORY  Chief Complaint Eye Problem    HPI Tyrone Fox is a 72 y.o. male with a history of hypertension and stroke on aspirin who presents for evaluation of right eye blurry vision times 4 days.  Patient reports that he woke up with sudden onset blurry vision 4 days ago and that has been moderate, constant, stable, and not worsening over the last few days.  Patient denies visual field cuts, headache, jaw claudication, fatigue, weight loss, headache, facial droop, slurred speech, weakness or numbness, pain with extraocular movements, fever or chills.  He does not have an eye doctor.  He had cataract surgery several years ago in [place].  Past Medical History:  Diagnosis Date  . Hypertension   . Stroke [facility])     Patient Active Problem List   Diagnosis Date Noted  . Special screening for malignant neoplasms, colon 07/02/2016    Past Surgical History:  Procedure Laterality Date  . CHOLECYSTECTOMY    . COLONOSCOPY N/A 03/04/2017   Procedure: COLONOSCOPY;  Surgeon: Rogene Houston, MD;  Location: [facility];  Service: Endoscopy;  Laterality: N/A;  830  . FLEXIBLE BRONCHOSCOPY N/A 02/05/2017   Procedure: FLEXIBLE BRONCHOSCOPY;  Surgeon: [name], MD;  Location: [facility] ORS;  Service: Pulmonary;  Laterality: N/A;  . TONSILLECTOMY      Prior to Admission medications   Medication Sig Start Date End Date Taking? Authorizing Provider  dextromethorphan-guaiFENesin (MUCINEX DM) 30-600 MG 12hr tablet Take 1 tablet by mouth 2 (two) times daily.    [provider]  esomeprazole (NEXIUM) 20 MG capsule Take 40 mg by mouth daily.     [provider]  fluticasone (FLONASE) 50 MCG/ACT nasal spray Place 2 sprays into both nostrils at  bedtime. Patient taking differently: Place 2 sprays into both nostrils daily as needed for allergies.  01/08/17   [name], MD  loratadine (CLARITIN) 10 MG tablet Take 10 mg by mouth daily as needed for allergies.    [provider]  Rivaroxaban (XARELTO) 15 MG TABS tablet Take 15 mg by mouth 2 (two) times daily with a meal. Take 15 mg by mouth twice daily with meals for 21 days. 06/08/17 06/29/17  [provider]  tamsulosin (FLOMAX) 0.4 MG CAPS capsule Take 0.4 mg by mouth 2 (two) times daily.     [provider]  VESICARE 5 MG tablet Take 5 mg by mouth daily. 10/22/16   [provider]    Allergies Penicillins  Family History  Problem Relation Age of Onset  . Heart disease Mother   . Hyperlipidemia Mother   . Heart disease Father   . Heart disease Maternal Grandmother   . Heart attack Maternal Grandmother     Social History Social History   Tobacco Use  . Smoking status: Never Smoker  . Smokeless tobacco: Never Used  Substance Use Topics  . Alcohol use: No  . Drug use: No    Review of Systems  Constitutional: Negative for fever. Eyes: + R eye blurry vision ENT: Negative for sore throat. Neck: No neck pain  Cardiovascular: Negative for chest pain. Respiratory: Negative for shortness of breath. Gastrointestinal: Negative for abdominal pain, vomiting or diarrhea. Genitourinary: Negative for dysuria. Musculoskeletal: Negative for back pain. Skin: Negative for rash. Neurological: Negative for headaches, weakness or numbness.  Psych: No SI or HI  ____________________________________________   PHYSICAL EXAM:  VITAL SIGNS: ED Triage Vitals  Enc Vitals Group     BP 01/26/18 1330 122/79     Pulse Rate 01/26/18 1330 70     Resp 01/26/18 1330 18     Temp 01/26/18 1330 98.1 F (36.7 C)     Temp Source 01/26/18 1330 Oral     SpO2 01/26/18 1330 95 %     Weight 01/26/18 1330 207 lb (93.9 kg)     Height 01/26/18 1330 _0   (1.702 m)     Head Circumference --      Peak Flow --      Pain Score 01/26/18 1343 0     Pain Loc --      Pain Edu? --      Excl. in [place]? --     Constitutional: Alert and oriented. Well appearing and in no apparent distress. HEENT:      Head: Normocephalic and atraumatic.   No ttp over the temporal artery      EYE EXAM: EOMI and not painful. PERRL bilaterally. Intact consensual light reflex. No photophobia. Conjunctivae is non erythematous or edematous. Sclerae in anicteric. Eye lid everted and no foreign objects or stye observed. Visual fields are intact. Visual acuity is 20/20 bilaterally. No stye or chalazion. Ocular pressure is 14 bilaterally. No blepharitis. No erythema surrounding the eye      Mouth/Throat: Mucous membranes are moist.       Neck: Supple with no signs of meningismus. Cardiovascular: Regular rate and rhythm. No murmurs, gallops, or rubs. 2+ symmetrical distal pulses are present in all extremities. No JVD. Respiratory: Normal respiratory effort. Lungs are clear to auscultation bilaterally. No wheezes, crackles, or rhonchi.  Musculoskeletal: Nontender with normal range of motion in all extremities. No edema, cyanosis, or erythema of extremities. Neurologic: A & O x3, PERRL, EOMI, no nystagmus, CN II-XII intact, motor testing reveals good tone and bulk throughout. There is no evidence of pronator drift or dysmetria. Muscle strength is 5/5 throughout. Sensory examination is intact. Gait is normal. Skin: Skin is warm, dry and intact. No rash noted. Psychiatric: Mood and affect are normal. Speech and behavior are normal.  ____________________________________________   LABS (all labs ordered are listed, but only abnormal results are displayed)  Labs Reviewed  GLUCOSE, CAPILLARY - Abnormal; Notable for the following components:      Result Value   Glucose-Capillary 198 (*)    All other components within normal limits  CBG MONITORING, ED    ____________________________________________  EKG  none ____________________________________________  RADIOLOGY  I have personally reviewed the images performed during this visit and I agree with the Radiologist's read.   Interpretation by Radiologist:  Ct Head Wo Contrast  Result Date: 01/26/2018 CLINICAL DATA:  Vertigo.  Right-sided blurry vision. EXAM: CT HEAD WITHOUT CONTRAST TECHNIQUE: Contiguous axial images were obtained from the base of the skull through the vertex without intravenous contrast. COMPARISON:  06/19/2017 FINDINGS: Brain: There is no evidence for acute hemorrhage, hydrocephalus, mass lesion, or abnormal extra-axial fluid collection. No definite CT evidence for acute infarction. Lacunar infarct left basal ganglia, stable in the interval. Diffuse loss of parenchymal volume is consistent with atrophy. Patchy low attenuation in the deep hemispheric and periventricular white matter is nonspecific, but likely reflects chronic microvascular ischemic demyelination. Vascular: Atherosclerotic calcification of the carotid siphons noted at the skull base. No dense MCA sign. Skull: No evidence for fracture. No worrisome lytic or  sclerotic lesion. Sinuses/Orbits: Complete opacification left sphenoid sinus. Remaining visualized paranasal sinuses and mastoid air cells are clear. Visualized portions of the globes and intraorbital fat are unremarkable. Other: None. IMPRESSION: 1. No acute intracranial abnormality. 2. Atrophy with chronic small vessel white matter ischemic disease. 3. Complete opacification of the left sphenoid sinus, compatible with chronic sinusitis. This is a new finding since 06/19/2017. Electronically Signed   By: Misty Stanley M.D.   On: 01/26/2018 14:21   ____________________________________________   PROCEDURES  Procedure(s) performed: None Procedures Critical Care performed:  None ____________________________________________   INITIAL IMPRESSION /  ASSESSMENT AND PLAN / ED COURSE   72 y.o. male with a history of hypertension and stroke on aspirin who presents for evaluation of right eye blurry vision times 4 days.  Eye exam at this time is reassuring with normal intraocular pressure, normal painless extraocular movements, pupils equal round and reactive, intact visual field, intact visual acuity, I am unable to perform a funduscopic exam due to patient's pupils being extremely small.  I discussed with Dr. Edison Pace who recommended close follow-up in the clinic tomorrow at 8 AM.  Patient had a CT that was negative for a stroke and since we are 4 days out of the onset of symptoms I do not believe he warrants an MRI at this time.  He has no pain with extraocular movements therefore CVT is thought to be less likely.  He also has no visual field cuts with no evidence of retinal detachment at this time.  He has no systemic symptoms, jaw claudication, or headache, also no tenderness to palpation over the temporal artery with no evidence of GCA.  I will discharge him home with close follow-up with ophthalmology.  Discussed return precautions for any of the above-mentioned signs.  Patient is comfortable with this plan.      As part of my medical decision making, I reviewed the following data within the [place] notes reviewed and incorporated, Labs reviewed , Radiograph reviewed , A consult was requested and obtained from this/these consultant(s) Ophthalmology, Notes from prior ED visits and [place] Controlled Substance Database    Pertinent labs & imaging results that were available during my care of the patient were reviewed by me and considered in my medical decision making (see chart for details).    ____________________________________________   FINAL CLINICAL IMPRESSION(S) / ED DIAGNOSES  Final diagnoses:  Blurry vision, right eye      NEW MEDICATIONS STARTED DURING THIS VISIT:  ED Discharge Orders    None        Note:  This document was prepared using Dragon voice recognition software and may include unintentional dictation errors.    Alfred Levins, [place], MD 01/26/18 [phone]

## 2018-11-03 DIAGNOSIS — Z9889 Other specified postprocedural states: Secondary | ICD-10-CM

## 2018-11-03 HISTORY — DX: Other specified postprocedural states: Z98.890

## 2019-11-15 ENCOUNTER — Ambulatory Visit: Payer: Medicare Other | Attending: Internal Medicine

## 2019-11-15 DIAGNOSIS — Z20822 Contact with and (suspected) exposure to covid-19: Secondary | ICD-10-CM

## 2019-11-17 ENCOUNTER — Telehealth: Payer: Self-pay

## 2019-11-17 LAB — NOVEL CORONAVIRUS, NAA: SARS-CoV-2, NAA: NOT DETECTED

## 2019-11-17 NOTE — Telephone Encounter (Signed)
Pt notified of negative COVID-19 results. Understanding verbalized.  Chasta M Hopkins   

## 2020-10-04 ENCOUNTER — Other Ambulatory Visit: Payer: Self-pay | Admitting: Student

## 2020-10-04 ENCOUNTER — Other Ambulatory Visit: Payer: Self-pay

## 2020-10-04 ENCOUNTER — Other Ambulatory Visit (HOSPITAL_COMMUNITY): Payer: Self-pay | Admitting: Student

## 2020-10-04 ENCOUNTER — Ambulatory Visit
Admission: RE | Admit: 2020-10-04 | Discharge: 2020-10-04 | Disposition: A | Discharge status: Home / Self Care | Payer: Medicare Other | Source: Ambulatory Visit | Attending: Student | Admitting: Student

## 2020-10-04 DIAGNOSIS — Z86718 Personal history of other venous thrombosis and embolism: Secondary | ICD-10-CM | POA: Diagnosis present

## 2020-10-04 DIAGNOSIS — M79605 Pain in left leg: Secondary | ICD-10-CM | POA: Insufficient documentation

## 2020-10-12 ENCOUNTER — Inpatient Hospital Stay: Payer: Medicare Other | Attending: Internal Medicine | Admitting: Internal Medicine

## 2020-10-12 ENCOUNTER — Inpatient Hospital Stay: Payer: Medicare Other

## 2020-10-12 ENCOUNTER — Other Ambulatory Visit: Payer: Self-pay

## 2020-10-12 ENCOUNTER — Encounter: Payer: Self-pay | Admitting: Internal Medicine

## 2020-10-12 DIAGNOSIS — Z86718 Personal history of other venous thrombosis and embolism: Secondary | ICD-10-CM | POA: Diagnosis not present

## 2020-10-12 DIAGNOSIS — I82402 Acute embolism and thrombosis of unspecified deep veins of left lower extremity: Secondary | ICD-10-CM

## 2020-10-12 DIAGNOSIS — Z7901 Long term (current) use of anticoagulants: Secondary | ICD-10-CM | POA: Diagnosis not present

## 2020-10-12 DIAGNOSIS — Z86711 Personal history of pulmonary embolism: Secondary | ICD-10-CM | POA: Insufficient documentation

## 2020-10-12 DIAGNOSIS — I10 Essential (primary) hypertension: Secondary | ICD-10-CM | POA: Diagnosis not present

## 2020-10-12 DIAGNOSIS — C61 Malignant neoplasm of prostate: Secondary | ICD-10-CM | POA: Diagnosis not present

## 2020-10-12 DIAGNOSIS — Z8673 Personal history of transient ischemic attack (TIA), and cerebral infarction without residual deficits: Secondary | ICD-10-CM | POA: Insufficient documentation

## 2020-10-12 DIAGNOSIS — D6859 Other primary thrombophilia: Secondary | ICD-10-CM | POA: Insufficient documentation

## 2020-10-12 LAB — CBC WITH DIFFERENTIAL/PLATELET
Abs Immature Granulocytes: 0.04 10*3/uL (ref 0.00–0.07)
Basophils Absolute: 0 10*3/uL (ref 0.0–0.1)
Basophils Relative: 0 %
Eosinophils Absolute: 0.1 10*3/uL (ref 0.0–0.5)
Eosinophils Relative: 1 %
HCT: 48.7 % (ref 39.0–52.0)
Hemoglobin: 16.5 g/dL (ref 13.0–17.0)
Immature Granulocytes: 0 %
Lymphocytes Relative: 19 %
Lymphs Abs: 1.8 10*3/uL (ref 0.7–4.0)
MCH: 31.7 pg (ref 26.0–34.0)
MCHC: 33.9 g/dL (ref 30.0–36.0)
MCV: 93.5 fL (ref 80.0–100.0)
Monocytes Absolute: 1 10*3/uL (ref 0.1–1.0)
Monocytes Relative: 11 %
Neutro Abs: 6.5 10*3/uL (ref 1.7–7.7)
Neutrophils Relative %: 69 %
Platelets: 327 10*3/uL (ref 150–400)
RBC: 5.21 MIL/uL (ref 4.22–5.81)
RDW: 12.2 % (ref 11.5–15.5)
WBC: 9.4 10*3/uL (ref 4.0–10.5)
nRBC: 0 % (ref 0.0–0.2)

## 2020-10-12 LAB — COMPREHENSIVE METABOLIC PANEL
ALT: 27 U/L (ref 0–44)
AST: 29 U/L (ref 15–41)
Albumin: 3.9 g/dL (ref 3.5–5.0)
Alkaline Phosphatase: 53 U/L (ref 38–126)
Anion gap: 9 (ref 5–15)
BUN: 14 mg/dL (ref 8–23)
CO2: 27 mmol/L (ref 22–32)
Calcium: 9.3 mg/dL (ref 8.9–10.3)
Chloride: 103 mmol/L (ref 98–111)
Creatinine, Ser: 1.1 mg/dL (ref 0.61–1.24)
GFR, Estimated: >60 mL/min (ref >60)
Glucose, Bld: 176 mg/dL — ABNORMAL HIGH (ref 70–99)
Potassium: 4.6 mmol/L (ref 3.5–5.1)
Sodium: 139 mmol/L (ref 135–145)
Total Bilirubin: 1.8 mg/dL — ABNORMAL HIGH (ref 0.3–1.2)
Total Protein: 8.1 g/dL (ref 6.5–8.1)

## 2020-10-12 LAB — FIBRIN DERIVATIVES D-DIMER (ARMC ONLY): Fibrin derivatives D-dimer (ARMC): 3702.1 ng/mL (FEU) — ABNORMAL HIGH (ref 0.00–499.00)

## 2020-10-12 LAB — LACTATE DEHYDROGENASE: LDH: 164 U/L (ref 98–192)

## 2020-10-12 NOTE — Progress Notes (Signed)
[name] NOTE  Patient Care Team: [name], MD as PCP - General (Family Medicine)  CHIEF COMPLAINTS/PURPOSE OF CONSULTATION: DVT   # RIGHT LE DV-[? 2017-2018] [ on xarelto]; ? Stroke [in [place]]x ? Duration; STOPPED xarelto.   # DEC 2nd 2021- LEFT LOWER EXTREMITY Acute deep venous thrombosis involving the mid to distal LEFT femoral vein, LEFT popliteal vein and extending into LEFT posterior tibial vein-on xarelto   # PROSTATE CANCER [At [facility]- incidental; Dr.Davis]-    Oncology History   No history exists.     HISTORY OF PRESENTING ILLNESS:  Tyrone Fox 74 y.o.  male patient has been referred to [place] for further evaluation recommendation for left lower extremity DVT.  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  Patient noted to have pain in his posterior knee/calf for the last 2 weeks prior to presentation.  Further work-up with an ultrasound showed blood clot/DVT as summarized above.  No risk factors noted except his son who has intellectual disabilities lives in a group on had a blood clot.  As per the patient's son has limited mobility.  With regards risk factors: Long distance travel-none Immobilization/trauma: none Previous history of DVT/PE: yes- see above.  Family history: son had blood clot [group home]   Review of Systems  Constitutional: Negative for chills, diaphoresis, fever, malaise/fatigue and weight loss.  HENT: Negative for nosebleeds and sore throat.   Eyes: Negative for double vision.  Respiratory: Negative for cough, hemoptysis, sputum production, shortness of breath and wheezing.   Cardiovascular: Negative for chest pain, palpitations, orthopnea and leg swelling.  Gastrointestinal: Negative for abdominal pain, blood in stool, constipation, diarrhea, heartburn, melena, nausea and vomiting.  Genitourinary: Negative for  dysuria, frequency and urgency.  Musculoskeletal: Negative for back pain and joint pain.  Skin: Negative.  Negative for itching and rash.  Neurological: Negative for dizziness, tingling, focal weakness, weakness and headaches.  Endo/Heme/Allergies: Does not bruise/bleed easily.  Psychiatric/Behavioral: Negative for depression. The patient is not nervous/anxious and does not have insomnia.      MEDICAL HISTORY:  Past Medical History:  Diagnosis Date  . History of prostate surgery 2020  . Hypertension   . Pre-diabetes   . Stroke [facility])     SURGICAL HISTORY: Past Surgical History:  Procedure Laterality Date  . CHOLECYSTECTOMY    . COLONOSCOPY N/A 03/04/2017   Procedure: COLONOSCOPY;  Surgeon: Rogene Houston, MD;  [facility];  Service: Endoscopy;  Laterality: N/A;  830  . FLEXIBLE BRONCHOSCOPY N/A 02/05/2017   Procedure: FLEXIBLE BRONCHOSCOPY;  Surgeon: [name], MD;  Location: [facility] ORS;  Service: Pulmonary;  Laterality: N/A;  . TONSILLECTOMY      SOCIAL HISTORY: Social History   Socioeconomic History  . Marital status: Married    Spouse name: Not on file  . Number of children: Not on file  . Years of education: Not on file  . Highest education level: Not on file  Occupational History  . Not on file  Tobacco Use  . Smoking status: Never Smoker  . Smokeless tobacco: Never Used  Vaping Use  . Vaping Use: Never used  Substance and Sexual Activity  . Alcohol use: No  . Drug use: No  . Sexual activity: Not on file  Other Topics Concern  . Not on file  Social History Narrative   Lives in [place]; with  wife; never smoked; 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    Social Determinants of Health   Financial Resource Strain: Not on file  Food Insecurity: Not on file  Transportation Needs: Not on file  Physical Activity: Not on file  Stress: Not on file  Social Connections: Not on file  Intimate Partner Violence: Not on file    FAMILY HISTORY: Family  History  Problem Relation Age of Onset  . Heart disease Mother   . Hyperlipidemia Mother   . Heart disease Father   . Heart disease Maternal Grandmother   . Heart attack Maternal Grandmother     ALLERGIES:  is allergic to ceftriaxone and penicillins.  MEDICATIONS:  Current Outpatient Medications  Medication Sig Dispense Refill  . amLODipine (NORVASC) 2.5 MG tablet TAKE 1 TABLET(2.5 MG) BY MOUTH EVERY DAY    . atorvastatin (LIPITOR) 40 MG tablet Take by mouth.    . dextromethorphan-guaiFENesin (MUCINEX DM) 30-600 MG 12hr tablet Take 1 tablet by mouth 2 (two) times daily.    Marland Kitchen esomeprazole (NEXIUM) 20 MG capsule Take 40 mg by mouth daily.     . fluticasone (FLONASE) 50 MCG/ACT nasal spray Place 2 sprays into both nostrils at bedtime. (Patient taking differently: Place 2 sprays into both nostrils daily as needed for allergies.) 16 g 2  . glipiZIDE (GLUCOTROL XL) 2.5 MG 24 hr tablet Take by mouth.    . loratadine (CLARITIN) 10 MG tablet Take 10 mg by mouth daily as needed for allergies.    . Multiple Vitamins-Minerals (CENTRUM SILVER) CHEW Chew 1 tablet by mouth daily.    . Rivaroxaban (XARELTO) 15 MG TABS tablet Take 15 mg by mouth 2 (two) times daily with a meal. Take 15 mg by mouth twice daily with meals for 21 days.    . traZODone (DESYREL) 50 MG tablet Take by mouth.    . VESICARE 5 MG tablet Take 5 mg by mouth daily.  1  . tamsulosin (FLOMAX) 0.4 MG CAPS capsule Take 0.4 mg by mouth 2 (two) times daily.  (Patient not taking: Reported on 10/12/2020)     No current facility-administered medications for this visit.      Marland Kitchen  PHYSICAL EXAMINATION:  Vitals:   10/12/20 1128  BP: 126/76  Pulse: 71  Temp: 98 F (36.7 C)  SpO2: 98%   Filed Weights   10/12/20 1128  Weight: 213 lb 6.4 oz (96.8 kg)    Physical Exam HENT:     Head: Normocephalic and atraumatic.     Mouth/Throat:     Mouth: Oropharynx is clear and moist.     Pharynx: No oropharyngeal exudate.  Eyes:      Pupils: Pupils are equal, round, and reactive to light.  Cardiovascular:     Rate and Rhythm: Normal rate and regular rhythm.  Pulmonary:     Effort: Pulmonary effort is normal. No respiratory distress.     Breath sounds: Normal breath sounds. No wheezing.  Abdominal:     General: Bowel sounds are normal. There is no distension.     Palpations: Abdomen is soft. There is no mass.     Tenderness: There is no abdominal tenderness. There is no guarding or rebound.  Musculoskeletal:        General: No tenderness or edema. Normal range of motion.     Cervical back: Normal range of motion and neck supple.  Skin:    General: Skin is warm.  Neurological:     Mental Status: He is alert and  oriented to person, place, and time.  Psychiatric:        Mood and Affect: Affect normal.      LABORATORY DATA:  I have reviewed the data as listed Lab Results  Component Value Date   WBC 9.4 10/12/2020   HGB 16.5 10/12/2020   HCT 48.7 10/12/2020   MCV 93.5 10/12/2020   PLT 327 10/12/2020   Recent Labs    10/12/20 1225  NA 139  K 4.6  CL 103  CO2 27  GLUCOSE 176*  BUN 14  CREATININE 1.10  CALCIUM 9.3  GFRNONAA >60  PROT 8.1  ALBUMIN 3.9  AST 29  ALT 27  ALKPHOS 53  BILITOT 1.8*    RADIOGRAPHIC STUDIES: I have personally reviewed the radiological images as listed and agreed with the findings in the report. US Venous Img Lower Unilateral Left (DVT)  Result Date: 10/04/2020 CLINICAL DATA:  Pain behind LEFT knee for 1 week EXAM: LEFT LOWER EXTREMITY VENOUS DOPPLER ULTRASOUND TECHNIQUE: Gray-scale sonography with compression, as well as color and duplex ultrasound, were performed to evaluate the deep venous system(s) from the level of the common femoral vein through the popliteal and proximal calf veins. COMPARISON:  None FINDINGS: VENOUS LEFT common femoral and proximal LEFT femoral vein are patent and compressible with spontaneous venous flow. Occlusive intraluminal deep venous thrombus  identified within the mid to distal LEFT femoral vein extending into LEFT popliteal vein and posterior tibial vein. Associated impaired compressibility and absent spontaneous venous flow. Findings consistent with acute deep venous thrombosis. Peroneal vein not visualized. Limited views of the contralateral common femoral vein are unremarkable. OTHER N/A Limitations: None IMPRESSION: Acute deep venous thrombosis involving the mid to distal LEFT femoral vein, LEFT popliteal vein and extending into LEFT posterior tibial vein as above. These results will be called to the ordering clinician or representative by the Radiologist Assistant, and communication documented in the PACS or [employer]. Electronically Signed   By: Lavonia Dana M.D.   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  Discussed the length of anticoagulation will depend upon further work-up/patient tolerance to therapy/etc. counseled the patient regarding risk of falls/bleeding while on anticoagulation.    # ETIOLOGY: No clear etiology noted; prior right lower extremity DVT.  Recommend hypercoagulable work-up-factor V Leiden/prothrombin gene mutation while on anticoagulation.  #Financial issues: Given the cost issues will check with our pharmacy regarding any financial assistance.  Bethena Roys will reach out to the patient  Thank you Ms.Eulas Post for allowing me to participate in the care of your pleasant patient. Please do not hesitate to contact me with questions or concerns in the interim.  # DISPOSITION: # labs today- ordered today.  # Follow up in 3 months- MD; labs- cbc/bmp;Dr.B  # 45 minutes face-to-face with the patient discussing the above plan of care; more than 50% of time spent on prognosis/ natural history; counseling and coordination.   All questions were  answered. The patient knows to call the clinic with any problems, questions or concerns.    Cammie Sickle, MD 10/12/2020 3:23 PM

## 2020-10-12 NOTE — Assessment & Plan Note (Addendum)
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  Discussed the length of anticoagulation will depend upon further work-up/patient tolerance to therapy/etc. counseled the patient regarding risk of falls/bleeding while on anticoagulation.    # ETIOLOGY: No clear etiology noted; prior right lower extremity DVT.  Recommend hypercoagulable work-up-factor V Leiden/prothrombin gene mutation while on anticoagulation.  #Financial issues: Given the cost issues will check with our pharmacy regarding any financial assistance.  Bethena Roys will reach out to the patient  Thank you Ms.Eulas Post for allowing me to participate in the care of your pleasant patient. Please do not hesitate to contact me with questions or concerns in the interim.  # DISPOSITION: # labs today- ordered today.  # Follow up in 3 months- MD; labs- cbc/bmp;Dr.B  # 45 minutes face-to-face with the patient discussing the above plan of care; more than 50% of time spent on prognosis/ natural history; counseling and coordination.

## 2020-10-12 NOTE — Progress Notes (Signed)
Patient here for inital oncology follow-up appointment, expresses that pain in legs has resolved.

## 2020-10-16 LAB — FACTOR 5 LEIDEN

## 2020-10-18 LAB — PROTHROMBIN GENE MUTATION

## 2021-01-11 ENCOUNTER — Other Ambulatory Visit: Payer: Self-pay

## 2021-01-11 ENCOUNTER — Inpatient Hospital Stay: Payer: Medicare Other | Admitting: Internal Medicine

## 2021-01-11 ENCOUNTER — Inpatient Hospital Stay: Payer: Medicare Other | Attending: Internal Medicine

## 2021-01-11 ENCOUNTER — Telehealth: Payer: Self-pay | Admitting: *Deleted

## 2021-01-11 DIAGNOSIS — Z7901 Long term (current) use of anticoagulants: Secondary | ICD-10-CM | POA: Diagnosis not present

## 2021-01-11 DIAGNOSIS — I82402 Acute embolism and thrombosis of unspecified deep veins of left lower extremity: Secondary | ICD-10-CM | POA: Insufficient documentation

## 2021-01-11 LAB — CBC WITH DIFFERENTIAL/PLATELET
Abs Immature Granulocytes: 0.02 10*3/uL (ref 0.00–0.07)
Basophils Absolute: 0 10*3/uL (ref 0.0–0.1)
Basophils Relative: 1 %
Eosinophils Absolute: 0.1 10*3/uL (ref 0.0–0.5)
Eosinophils Relative: 1 %
HCT: 49.3 % (ref 39.0–52.0)
Hemoglobin: 16.9 g/dL (ref 13.0–17.0)
Immature Granulocytes: 0 %
Lymphocytes Relative: 18 %
Lymphs Abs: 1.6 10*3/uL (ref 0.7–4.0)
MCH: 31.9 pg (ref 26.0–34.0)
MCHC: 34.3 g/dL (ref 30.0–36.0)
MCV: 93.2 fL (ref 80.0–100.0)
Monocytes Absolute: 0.7 10*3/uL (ref 0.1–1.0)
Monocytes Relative: 8 %
Neutro Abs: 6.4 10*3/uL (ref 1.7–7.7)
Neutrophils Relative %: 72 %
Platelets: 256 10*3/uL (ref 150–400)
RBC: 5.29 MIL/uL (ref 4.22–5.81)
RDW: 12.8 % (ref 11.5–15.5)
WBC: 8.9 10*3/uL (ref 4.0–10.5)
nRBC: 0 % (ref 0.0–0.2)

## 2021-01-11 LAB — BASIC METABOLIC PANEL WITH GFR
Anion gap: 7 (ref 5–15)
BUN: 12 mg/dL (ref 8–23)
CO2: 26 mmol/L (ref 22–32)
Calcium: 9.2 mg/dL (ref 8.9–10.3)
Chloride: 103 mmol/L (ref 98–111)
Creatinine, Ser: 1.21 mg/dL (ref 0.61–1.24)
GFR, Estimated: >60 mL/min
Glucose, Bld: 245 mg/dL — ABNORMAL HIGH (ref 70–99)
Potassium: 3.8 mmol/L (ref 3.5–5.1)
Sodium: 136 mmol/L (ref 135–145)

## 2021-01-11 MED ORDER — RIVAROXABAN 20 MG PO TABS: 20.0000 mg | ORAL_TABLET | Freq: Every day | ORAL | 2 refills | Status: DC

## 2021-01-11 NOTE — Telephone Encounter (Signed)
Patient called to report that he is taking Xarelto 20 mg and that he has a 30 day supply on hand.

## 2021-01-11 NOTE — Assessment & Plan Note (Addendum)
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  Current on Xarelto 20 mg a day.  Discussed will consider lower dose of anticoagulation after 6 months-Eliquis 2.5 mg twice daily or Xarelto 10 mg a day.  # ETIOLOGY: No clear etiology noted; prior right lower extremity DVT.  Factor V Leiden/prothrombin gene mutation-negative.   *Called pharmacy patient taking Xarelto 20 mg; new prescription sent for 3 months. # DISPOSITION: # follow up in  63months-MD; labs- cbc/bmp/d-dimer- Dr.B

## 2021-01-11 NOTE — Progress Notes (Signed)
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 Stroke [in [place]]x ? Duration; STOPPED xarelto.   # DEC 2nd 2021- LEFT LOWER EXTREMITY Acute deep venous thrombosis involving the mid to distal LEFT femoral vein, LEFT popliteal vein and extending into LEFT posterior tibial vein-on xarelto ; Family history: son had blood clot [group home]-factor V Leiden/prothrombin gene mutation negative  # PROSTATE CANCER [At [facility]- incidental; Dr.Davis]-    Oncology History   No history exists.     HISTORY OF PRESENTING ILLNESS:  SEMISI Fox 75 y.o.  male patient  uprovoked left lower extremity DVT/recurrent-currently on Xarelto stable follow-up.  Patient denies any worsening swelling in the legs.  Denies any blood in stools or black or stools.  No nausea no vomiting.  No falls.   Review of Systems  Constitutional: Negative for chills, diaphoresis, fever, malaise/fatigue and weight loss.  HENT: Negative for nosebleeds and sore throat.   Eyes: Negative for double vision.  Respiratory: Negative for cough, hemoptysis, sputum production, shortness of breath and wheezing.   Cardiovascular: Negative for chest pain, palpitations, orthopnea and leg swelling.  Gastrointestinal: Negative for abdominal pain, blood in stool, constipation, diarrhea, heartburn, melena, nausea and vomiting.  Genitourinary: Negative for dysuria, frequency and urgency.  Musculoskeletal: Negative for back pain and joint pain.  Skin: Negative.  Negative for itching and rash.  Neurological: Negative for dizziness, tingling, focal weakness, weakness and headaches.  Endo/Heme/Allergies: Does not bruise/bleed easily.  Psychiatric/Behavioral: Negative for depression. The patient is not nervous/anxious and does not have insomnia.      MEDICAL HISTORY:  Past Medical  History:  Diagnosis Date  . History of prostate surgery 2020  . Hypertension   . Pre-diabetes   . Stroke [facility])     SURGICAL HISTORY: Past Surgical History:  Procedure Laterality Date  . CHOLECYSTECTOMY    . COLONOSCOPY N/A 03/04/2017   Procedure: COLONOSCOPY;  Surgeon: Rogene Houston, MD;  Location[facility];  Service: Endoscopy;  Laterality: N/A;  830  . FLEXIBLE BRONCHOSCOPY N/A 02/05/2017   Procedure: FLEXIBLE BRONCHOSCOPY;  Surgeon: [name], MD;  Location: [facility] ORS;  Service: Pulmonary;  Laterality: N/A;  . TONSILLECTOMY      SOCIAL HISTORY: Social History   Socioeconomic History  . Marital status: Married    Spouse name: Not on file  . Number of children: Not on file  . Years of education: Not on file  . Highest education level: Not on file  Occupational History  . Not on file  Tobacco Use  . Smoking status: Never Smoker  . Smokeless tobacco: Never Used  Vaping Use  . Vaping Use: Never used  Substance and Sexual Activity  . Alcohol use: No  . Drug use: No  . Sexual activity: Not on file  Other Topics Concern  . 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    Social Determinants of Health   Financial Resource Strain: Not on file  Food Insecurity: Not on file  Transportation Needs: Not on file  Physical Activity: Not on file  Stress: Not on file  Social Connections: Not on file  Intimate Partner Violence: Not on file    FAMILY HISTORY: Family History  Problem Relation Age of Onset  . Heart disease Mother   .  Hyperlipidemia Mother   . Heart disease Father   . Heart disease Maternal Grandmother   . Heart attack Maternal Grandmother     ALLERGIES:  is allergic to ceftriaxone and penicillins.  MEDICATIONS:  Current Outpatient Medications  Medication Sig Dispense Refill  . amLODipine (NORVASC) 2.5 MG tablet TAKE 1 TABLET(2.5 MG) BY MOUTH EVERY DAY    . atorvastatin  (LIPITOR) 40 MG tablet Take by mouth.    Marland Kitchen glipiZIDE (GLUCOTROL XL) 2.5 MG 24 hr tablet Take by mouth.    . montelukast (SINGULAIR) 10 MG tablet TAKE 1 TABLET(10 MG) BY MOUTH EVERY NIGHT    . Multiple Vitamins-Minerals (CENTRUM SILVER) CHEW Chew 1 tablet by mouth daily.    Marland Kitchen omeprazole (PRILOSEC) 40 MG capsule Take 40 mg by mouth daily.    . traZODone (DESYREL) 50 MG tablet Take by mouth.    . dextromethorphan-guaiFENesin (MUCINEX DM) 30-600 MG 12hr tablet Take 1 tablet by mouth 2 (two) times daily. (Patient not taking: Reported on 01/11/2021)    . esomeprazole (NEXIUM) 20 MG capsule Take 40 mg by mouth daily.  (Patient not taking: Reported on 01/11/2021)    . fluticasone (FLONASE) 50 MCG/ACT nasal spray Place 2 sprays into both nostrils at bedtime. (Patient not taking: Reported on 01/11/2021) 16 g 2  . loratadine (CLARITIN) 10 MG tablet Take 10 mg by mouth daily as needed for allergies. (Patient not taking: Reported on 01/11/2021)    . rivaroxaban (XARELTO) 20 MG TABS tablet Take 1 tablet (20 mg total) by mouth daily. PT UNSURE OF DOSE 30 tablet 2  . tamsulosin (FLOMAX) 0.4 MG CAPS capsule Take 0.4 mg by mouth 2 (two) times daily.  (Patient not taking: No sig reported)    . VESICARE 5 MG tablet Take 5 mg by mouth daily. (Patient not taking: Reported on 01/11/2021)  1   No current facility-administered medications for this visit.      Marland Kitchen  PHYSICAL EXAMINATION:  Vitals:   01/11/21 1104  BP: 120/85  Pulse: 90  Resp: 18  Temp: 98.4 F (36.9 C)  SpO2: 96%   Filed Weights   01/11/21 1104  Weight: 215 lb (97.5 kg)    Physical Exam HENT:     Head: Normocephalic and atraumatic.     Mouth/Throat:     Pharynx: No oropharyngeal exudate.  Eyes:     Pupils: Pupils are equal, round, and reactive to light.  Cardiovascular:     Rate and Rhythm: Normal rate and regular rhythm.  Pulmonary:     Effort: Pulmonary effort is normal. No respiratory distress.     Breath sounds: Normal breath sounds.  No wheezing.  Abdominal:     General: Bowel sounds are normal. There is no distension.     Palpations: Abdomen is soft. There is no mass.     Tenderness: There is no abdominal tenderness. There is no guarding or rebound.  Musculoskeletal:        General: No tenderness. Normal range of motion.     Cervical back: Normal range of motion and neck supple.  Skin:    General: Skin is warm.  Neurological:     Mental Status: He is alert and oriented to person, place, and time.  Psychiatric:        Mood and Affect: Affect normal.      LABORATORY DATA:  I have reviewed the data as listed Lab Results  Component Value Date   WBC 8.9 01/11/2021   HGB 16.9 01/11/2021  HCT 49.3 01/11/2021   MCV 93.2 01/11/2021   PLT 256 01/11/2021   Recent Labs    10/12/20 1225 01/11/21 1003  NA 139 136  K 4.6 3.8  CL 103 103  CO2 27 26  GLUCOSE 176* 245*  BUN 14 12  CREATININE 1.10 1.21  CALCIUM 9.3 9.2  GFRNONAA >60 >60  PROT 8.1  --   ALBUMIN 3.9  --   AST 29  --   ALT 27  --   ALKPHOS 53  --   BILITOT 1.8*  --     RADIOGRAPHIC STUDIES: I have personally reviewed the radiological images as listed and agreed with the findings in the report. No results found.  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  Current on Xarelto 20 mg a day.  Discussed will consider lower dose of anticoagulation after 6 months-Eliquis 2.5 mg twice daily or Xarelto 10 mg a day.  # ETIOLOGY: No clear etiology noted; prior right lower extremity DVT.  Factor V Leiden/prothrombin gene mutation-negative.   *Called pharmacy patient taking Xarelto 20 mg; new prescription sent for 3 months. # DISPOSITION: # follow up in  47month-MD; labs- cbc/bmp/d-dimer- Dr.B   All questions were answered. The patient knows to call the clinic with any problems, questions  or concerns.    GCammie Sickle MD 01/11/2021 1:44 PM

## 2021-01-14 NOTE — Telephone Encounter (Signed)
RF sent on 3/11

## 2021-01-14 NOTE — Telephone Encounter (Signed)
Dr. Jacinto Reap - do we need send any additional RFs?

## 2021-02-08 ENCOUNTER — Other Ambulatory Visit: Payer: Self-pay | Admitting: Internal Medicine

## 2021-02-08 ENCOUNTER — Telehealth: Payer: Self-pay | Admitting: *Deleted

## 2021-02-08 MED ORDER — RIVAROXABAN 20 MG PO TABS: 20.0000 mg | ORAL_TABLET | Freq: Every day | ORAL | 1 refills | Status: DC

## 2021-02-08 NOTE — Telephone Encounter (Signed)
Jenifer- please inform pt that he was recommended to have Eliquis at the current dose until June/2 more months.  Thereafter he will need to be on a reduced dose.  We will send a prescription for Eliquis 5 mg twice a day current dose for the next 2 months/confusion.   Thanks, GB

## 2021-02-08 NOTE — Telephone Encounter (Signed)
Patient called stating that he is out of his Xarelto 20 mg tabs and that he will need a refill, but he also thinks he is to have a dose reduction and is asking if we could call him back and refill his medicine today. 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  Current on Xarelto 20 mg a day.  Discussed will consider lower dose of anticoagulation after 6 months-Eliquis 2.5 mg twice daily or Xarelto 10 mg a day.

## 2021-02-08 NOTE — Telephone Encounter (Signed)
RN called and spoke with pt, instructed that Dr Tish Men wanted him to continue taking Xarelto 20mg  and prescription has been sent to pharmacy.  Nurse explained that medication regime will change to Eliquis in June 2022 per MD and MD/nurse will advise and instruct at that time. Pt instructed to call if he has any further needs. Pt verbalized understanding of above medication regime.

## 2021-02-08 NOTE — Telephone Encounter (Signed)
Dr. B - please advise. 

## 2021-02-11 NOTE — Telephone Encounter (Signed)
Per Dr. Jacinto Reap- he will stay on Xarelto 20 mg until June and then switch the patient to eliquis at that time. Md sent new script for Xarelto RF

## 2021-02-18 ENCOUNTER — Other Ambulatory Visit: Payer: Self-pay | Admitting: *Deleted

## 2021-02-19 ENCOUNTER — Other Ambulatory Visit: Payer: Self-pay

## 2021-02-19 ENCOUNTER — Emergency Department: Payer: Medicare Other

## 2021-02-19 ENCOUNTER — Emergency Department
Admission: EM | Admit: 2021-02-19 | Discharge: 2021-02-19 | Disposition: A | Discharge status: Home / Self Care | Payer: Medicare Other | Attending: Emergency Medicine | Admitting: Emergency Medicine

## 2021-02-19 DIAGNOSIS — I2782 Chronic pulmonary embolism: Secondary | ICD-10-CM

## 2021-02-19 DIAGNOSIS — I2699 Other pulmonary embolism without acute cor pulmonale: Secondary | ICD-10-CM | POA: Insufficient documentation

## 2021-02-19 DIAGNOSIS — Z7901 Long term (current) use of anticoagulants: Secondary | ICD-10-CM | POA: Diagnosis not present

## 2021-02-19 DIAGNOSIS — I10 Essential (primary) hypertension: Secondary | ICD-10-CM | POA: Diagnosis not present

## 2021-02-19 DIAGNOSIS — R109 Unspecified abdominal pain: Secondary | ICD-10-CM | POA: Diagnosis present

## 2021-02-19 DIAGNOSIS — Z7984 Long term (current) use of oral hypoglycemic drugs: Secondary | ICD-10-CM | POA: Insufficient documentation

## 2021-02-19 DIAGNOSIS — R7303 Prediabetes: Secondary | ICD-10-CM | POA: Insufficient documentation

## 2021-02-19 DIAGNOSIS — N200 Calculus of kidney: Secondary | ICD-10-CM | POA: Diagnosis not present

## 2021-02-19 DIAGNOSIS — Z79899 Other long term (current) drug therapy: Secondary | ICD-10-CM | POA: Insufficient documentation

## 2021-02-19 LAB — COMPREHENSIVE METABOLIC PANEL
ALT: 29 U/L (ref 0–44)
AST: 25 U/L (ref 15–41)
Albumin: 3.9 g/dL (ref 3.5–5.0)
Alkaline Phosphatase: 49 U/L (ref 38–126)
Anion gap: 9 (ref 5–15)
BUN: 14 mg/dL (ref 8–23)
CO2: 23 mmol/L (ref 22–32)
Calcium: 9.2 mg/dL (ref 8.9–10.3)
Chloride: 105 mmol/L (ref 98–111)
Creatinine, Ser: 1.46 mg/dL — ABNORMAL HIGH (ref 0.61–1.24)
GFR, Estimated: 50 mL/min — ABNORMAL LOW (ref >60)
Glucose, Bld: 259 mg/dL — ABNORMAL HIGH (ref 70–99)
Potassium: 4.2 mmol/L (ref 3.5–5.1)
Sodium: 137 mmol/L (ref 135–145)
Total Bilirubin: 3.4 mg/dL — ABNORMAL HIGH (ref 0.3–1.2)
Total Protein: 7.8 g/dL (ref 6.5–8.1)

## 2021-02-19 LAB — CBC WITH DIFFERENTIAL/PLATELET
Abs Immature Granulocytes: 0.08 10*3/uL — ABNORMAL HIGH (ref 0.00–0.07)
Basophils Absolute: 0 10*3/uL (ref 0.0–0.1)
Basophils Relative: 0 %
Eosinophils Absolute: 0.1 10*3/uL (ref 0.0–0.5)
Eosinophils Relative: 0 %
HCT: 48.2 % (ref 39.0–52.0)
Hemoglobin: 16.9 g/dL (ref 13.0–17.0)
Immature Granulocytes: 1 %
Lymphocytes Relative: 8 %
Lymphs Abs: 1.1 10*3/uL (ref 0.7–4.0)
MCH: 31.8 pg (ref 26.0–34.0)
MCHC: 35.1 g/dL (ref 30.0–36.0)
MCV: 90.6 fL (ref 80.0–100.0)
Monocytes Absolute: 1.1 10*3/uL — ABNORMAL HIGH (ref 0.1–1.0)
Monocytes Relative: 8 %
Neutro Abs: 12.3 10*3/uL — ABNORMAL HIGH (ref 1.7–7.7)
Neutrophils Relative %: 83 %
Platelets: 235 10*3/uL (ref 150–400)
RBC: 5.32 MIL/uL (ref 4.22–5.81)
RDW: 12.4 % (ref 11.5–15.5)
WBC: 14.8 10*3/uL — ABNORMAL HIGH (ref 4.0–10.5)
nRBC: 0 % (ref 0.0–0.2)

## 2021-02-19 LAB — URINALYSIS, COMPLETE (UACMP) WITH MICROSCOPIC
Bacteria, UA: NONE SEEN
Bilirubin Urine: NEGATIVE
Glucose, UA: >=500 mg/dL — AB
Ketones, ur: 5 mg/dL — AB
Leukocytes,Ua: NEGATIVE
Nitrite: NEGATIVE
Protein, ur: 30 mg/dL — AB
Specific Gravity, Urine: 1.021 (ref 1.005–1.030)
pH: 6 (ref 5.0–8.0)

## 2021-02-19 LAB — LIPASE, BLOOD: Lipase: 25 U/L (ref 11–51)

## 2021-02-19 LAB — TROPONIN I (HIGH SENSITIVITY): Troponin I (High Sensitivity): 4 ng/L (ref <18)

## 2021-02-19 MED ORDER — RIVAROXABAN 20 MG PO TABS: 20.0000 mg | ORAL_TABLET | Freq: Every day | ORAL | 0 refills | Status: DC

## 2021-02-19 MED ORDER — HYDROMORPHONE HCL 1 MG/ML IJ SOLN
0.5000 mg | Freq: Once | INTRAMUSCULAR | Status: AC
  Administered 2021-02-19: 0.5 mg via INTRAVENOUS
  Filled 2021-02-19: qty 1

## 2021-02-19 MED ORDER — ONDANSETRON HCL 4 MG/2ML IJ SOLN
4.0000 mg | Freq: Once | INTRAMUSCULAR | Status: AC
  Administered 2021-02-19: 4 mg via INTRAVENOUS
  Filled 2021-02-19: qty 2

## 2021-02-19 MED ORDER — IOHEXOL 300 MG/ML  SOLN
100.0000 mL | Freq: Once | INTRAMUSCULAR | Status: AC | PRN
  Administered 2021-02-19: 100 mL via INTRAVENOUS

## 2021-02-19 MED ORDER — ONDANSETRON 4 MG PO TBDP: 4.0000 mg | ORAL_TABLET | Freq: Three times a day (TID) | ORAL | 0 refills | Status: AC | PRN

## 2021-02-19 MED ORDER — PROCHLORPERAZINE EDISYLATE 10 MG/2ML IJ SOLN
10.0000 mg | Freq: Once | INTRAMUSCULAR | Status: AC
  Administered 2021-02-19: 10 mg via INTRAVENOUS
  Filled 2021-02-19: qty 2

## 2021-02-19 MED ORDER — OXYCODONE-ACETAMINOPHEN 5-325 MG PO TABS: 1.0000 | ORAL_TABLET | ORAL | 0 refills | Status: DC | PRN

## 2021-02-19 MED ORDER — DEXTROSE 50 % IV SOLN
INTRAVENOUS | Status: AC
  Filled 2021-02-19: qty 50

## 2021-02-19 MED ORDER — MORPHINE SULFATE (PF) 4 MG/ML IV SOLN
4.0000 mg | Freq: Once | INTRAVENOUS | Status: AC
  Administered 2021-02-19: 4 mg via INTRAVENOUS
  Filled 2021-02-19: qty 1

## 2021-02-19 NOTE — ED Notes (Signed)
Pt alert and oriented X 4, stable for discharge. RR even and unlabored, color WNL. Discussed discharge instructions and follow-up as directed. Discharge medications discussed if prescribed. Pt had opportunity to ask questions, and RN to provide patient/family eduction.  

## 2021-02-19 NOTE — ED Provider Notes (Signed)
[facility] Emergency Department Provider Note   ____________________________________________   Event Date/Time   First MD Initiated Contact with Patient 02/19/21 [phone]     (approximate)  I have reviewed the triage vital signs and the nursing notes.   HISTORY  Chief Complaint Flank Pain    HPI Tyrone Fox is a 75 y.o. male with past medical history of hypertension, stroke, and DVT on Xarelto who presents to the ED complaining of flank pain.  Patient reports that he has been dealing with constant pain in his left flank since yesterday afternoon.  He describes it as sharp and not exacerbated or alleviated by anything.  It is been associated with nausea and episodes of dry heaving, but he has not vomited and denies diarrhea.  He denies any fevers, dysuria, hematuria, or groin pain.  He reports similar symptoms with a kidney stone 1 year ago.  He has taken Tylenol for the pain without significant relief.        Past Medical History:  Diagnosis Date  . History of prostate surgery 2020  . Hypertension   . Pre-diabetes   . Stroke [facility])     Patient Active Problem List   Diagnosis Date Noted  . Leg DVT (deep venous thromboembolism), acute, left ([place]) 10/12/2020  . Special screening for malignant neoplasms, colon 07/02/2016    Past Surgical History:  Procedure Laterality Date  . CHOLECYSTECTOMY    . COLONOSCOPY N/A 03/04/2017   Procedure: COLONOSCOPY;  Surgeon: Rogene Houston, MD;  Location[facility];  Service: Endoscopy;  Laterality: N/A;  830  . FLEXIBLE BRONCHOSCOPY N/A 02/05/2017   Procedure: FLEXIBLE BRONCHOSCOPY;  Surgeon: [name], MD;  Location: [facility] ORS;  Service: Pulmonary;  Laterality: N/A;  . TONSILLECTOMY      Prior to Admission medications   Medication Sig Start Date End Date Taking? Authorizing Provider  ondansetron (ZOFRAN ODT) 4 MG disintegrating tablet Take 1 tablet (4 mg total) by mouth every 8 (eight) hours as  needed for nausea or vomiting. 02/19/21  Yes Blake Divine, MD  oxyCODONE-acetaminophen (PERCOCET) 5-325 MG tablet Take 1 tablet by mouth every 4 (four) hours as needed for severe pain. 02/19/21 02/19/22 Yes Blake Divine, MD  amLODipine (NORVASC) 2.5 MG tablet TAKE 1 TABLET(2.5 MG) BY MOUTH EVERY DAY 08/21/20   [provider]  atorvastatin (LIPITOR) 40 MG tablet Take by mouth. 07/09/17   [provider]  dextromethorphan-guaiFENesin (MUCINEX DM) 30-600 MG 12hr tablet Take 1 tablet by mouth 2 (two) times daily. Patient not taking: Reported on 01/11/2021    [provider]  esomeprazole (NEXIUM) 20 MG capsule Take 40 mg by mouth daily.  Patient not taking: Reported on 01/11/2021    [provider]  fluticasone (FLONASE) 50 MCG/ACT nasal spray Place 2 sprays into both nostrils at bedtime. Patient not taking: Reported on 01/11/2021 01/08/17   [name], MD  glipiZIDE (GLUCOTROL XL) 2.5 MG 24 hr tablet Take by mouth. 05/31/20 05/31/21  [provider]  loratadine (CLARITIN) 10 MG tablet Take 10 mg by mouth daily as needed for allergies. Patient not taking: Reported on 01/11/2021    [provider]  montelukast (SINGULAIR) 10 MG tablet TAKE 1 TABLET(10 MG) BY MOUTH EVERY NIGHT 10/31/20   [provider]  Multiple Vitamins-Minerals (CENTRUM SILVER) CHEW Chew 1 tablet by mouth daily.    [provider]  omeprazole (PRILOSEC) 40 MG capsule Take 40 mg by mouth daily. 11/02/20   [provider]  rivaroxaban (XARELTO) 20 MG TABS tablet Take 1 tablet (20 mg total) by mouth daily. PT UNSURE OF DOSE 02/08/21   Cammie Sickle, MD  tamsulosin (FLOMAX) 0.4 MG CAPS capsule Take 0.4 mg by mouth 2 (two) times daily.  Patient not taking: No sig reported    [provider]  traZODone (DESYREL) 50 MG tablet Take by mouth. 08/18/17   [provider]  VESICARE 5 MG tablet Take 5 mg by mouth daily. Patient not  taking: Reported on 01/11/2021 10/22/16   [provider]    Allergies Ceftriaxone and Penicillins  Family History  Problem Relation Age of Onset  . Heart disease Mother   . Hyperlipidemia Mother   . Heart disease Father   . Heart disease Maternal Grandmother   . Heart attack Maternal Grandmother     Social History Social History   Tobacco Use  . Smoking status: Never Smoker  . Smokeless tobacco: Never Used  Vaping Use  . Vaping Use: Never used  Substance Use Topics  . Alcohol use: No  . Drug use: No    Review of Systems  Constitutional: No fever/chills Eyes: No visual changes. ENT: No sore throat. Cardiovascular: Denies chest pain. Respiratory: Denies shortness of breath. Gastrointestinal: No abdominal pain.  Positive for flank pain and nausea, no vomiting.  No diarrhea.  No constipation. Genitourinary: Negative for dysuria. Musculoskeletal: Negative for back pain. Skin: Negative for rash. Neurological: Negative for headaches, focal weakness or numbness.  ____________________________________________   PHYSICAL EXAM:  VITAL SIGNS: ED Triage Vitals  Enc Vitals Group     BP 02/19/21 0751 (!) 176/101     Pulse Rate 02/19/21 0751 62     Resp 02/19/21 0751 18     Temp 02/19/21 0751 97.9 F (36.6 C)     Temp Source 02/19/21 0751 Oral     SpO2 02/19/21 0751 98 %     Weight 02/19/21 0749 215 lb (97.5 kg)     Height 02/19/21 0749 _0  (1.702 m)     Head Circumference --      Peak Flow --      Pain Score 02/19/21 0749 9     Pain Loc --      Pain Edu? --      Excl. in [place]? --     Constitutional: Alert and oriented. Eyes: Conjunctivae are normal. Head: Atraumatic. Nose: No congestion/rhinnorhea. Mouth/Throat: Mucous membranes are moist. Neck: Normal ROM Cardiovascular: Normal rate, regular rhythm. Grossly normal heart sounds. Respiratory: Normal respiratory effort.  No retractions. Lungs CTAB. Gastrointestinal: Soft and nontender.  Left CVA  tenderness noted.  No distention. Genitourinary: deferred Musculoskeletal: No lower extremity tenderness nor edema. Neurologic:  Normal speech and language. No gross focal neurologic deficits are appreciated. Skin:  Skin is warm, dry and intact. No rash noted. Psychiatric: Mood and affect are normal. Speech and behavior are normal.  ____________________________________________   LABS (all labs ordered are listed, but only abnormal results are displayed)  Labs Reviewed  CBC WITH DIFFERENTIAL/PLATELET - Abnormal; Notable for the following components:      Result Value   WBC 14.8 (*)    Neutro Abs 12.3 (*)    Monocytes Absolute 1.1 (*)    Abs Immature Granulocytes 0.08 (*)    All other components within normal limits  COMPREHENSIVE METABOLIC PANEL - Abnormal; Notable for the following components:   Glucose, Bld 259 (*)    Creatinine, Ser 1.46 (*)    Total Bilirubin  3.4 (*)    GFR, Estimated 50 (*)    All other components within normal limits  URINALYSIS, COMPLETE (UACMP) WITH MICROSCOPIC - Abnormal; Notable for the following components:   Color, Urine YELLOW (*)    APPearance CLEAR (*)    Glucose, UA >=500 (*)    Hgb urine dipstick SMALL (*)    Ketones, ur 5 (*)    Protein, ur 30 (*)    All other components within normal limits  LIPASE, BLOOD  TROPONIN I (HIGH SENSITIVITY)     PROCEDURES  Procedure(s) performed (including Critical Care):  Procedures   ____________________________________________   INITIAL IMPRESSION / ASSESSMENT AND PLAN / ED COURSE       75 year old male with past medical history of hypertension, stroke, and DVT on Xarelto who presents to the ED complaining of constant sharp pain in his left flank since yesterday afternoon.  He has CVA tenderness on the left and we will further assess for kidney stone, pyelonephritis, aortic pathology with CT scan.  We will treat symptoms with IV morphine and Zofran, screening labs including LFTs and lipase.  UA is  pending at this time.  UA shows no signs of infection and labs are reassuring, renal function similar to previous.  CT scan shows obstructing left nephrolithiasis which seems to be the source of patient's symptoms.  He was incidentally found to have right-sided pulmonary embolus of indeterminate age.  He was diagnosed with DVT in December of last year, has been on Xarelto since then and reports being compliant.  He currently denies any chest pain or shortness of breath, vital signs are reassuring and I have low suspicion for acute PE.  This finding is likely chronic and was discussed with Dr. Lucky Cowboy of vascular surgery.  He states that patient may follow-up as an outpatient, may continue Xarelto as this was likely present at the time of his DVT diagnosis but just being discovered now.  Patient counseled to follow-up with urology as well as vascular surgery, counseled to continue Xarelto and we will prescribe Percocet as well as Zofran for use as needed.  Patient counseled to return to the ED for any new or worsening symptoms, patient agrees with plan.      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   Blake Divine, MD 02/19/21 1149

## 2021-02-19 NOTE — ED Triage Notes (Signed)
Pt comes with c/o flank pain and possible kidney stone. Pt states nausea and pain for over week.  Pt denies any pain with urination or symptoms.

## 2021-02-19 NOTE — ED Notes (Signed)
Pt states he had a kidney stone once before over a year ago.

## 2021-02-19 NOTE — ED Notes (Signed)
Pt states that he did not take his bloodpressure meds this morning but one last night; bp trending up/staying elevated

## 2021-02-24 ENCOUNTER — Encounter (HOSPITAL_COMMUNITY): Payer: Self-pay | Admitting: Emergency Medicine

## 2021-02-24 ENCOUNTER — Emergency Department (HOSPITAL_COMMUNITY)
Admission: EM | Admit: 2021-02-24 | Discharge: 2021-02-24 | Disposition: A | Discharge status: Home / Self Care | Payer: Medicare Other | Attending: Emergency Medicine | Admitting: Emergency Medicine

## 2021-02-24 DIAGNOSIS — Z79899 Other long term (current) drug therapy: Secondary | ICD-10-CM | POA: Insufficient documentation

## 2021-02-24 DIAGNOSIS — I10 Essential (primary) hypertension: Secondary | ICD-10-CM | POA: Diagnosis not present

## 2021-02-24 DIAGNOSIS — M549 Dorsalgia, unspecified: Secondary | ICD-10-CM | POA: Insufficient documentation

## 2021-02-24 DIAGNOSIS — N390 Urinary tract infection, site not specified: Secondary | ICD-10-CM

## 2021-02-24 DIAGNOSIS — N179 Acute kidney failure, unspecified: Secondary | ICD-10-CM

## 2021-02-24 DIAGNOSIS — R7303 Prediabetes: Secondary | ICD-10-CM | POA: Diagnosis not present

## 2021-02-24 DIAGNOSIS — Z87442 Personal history of urinary calculi: Secondary | ICD-10-CM | POA: Insufficient documentation

## 2021-02-24 DIAGNOSIS — Z7984 Long term (current) use of oral hypoglycemic drugs: Secondary | ICD-10-CM | POA: Diagnosis not present

## 2021-02-24 LAB — BASIC METABOLIC PANEL
Anion gap: 9 (ref 5–15)
BUN: 20 mg/dL (ref 8–23)
CO2: 23 mmol/L (ref 22–32)
Calcium: 8.8 mg/dL — ABNORMAL LOW (ref 8.9–10.3)
Chloride: 100 mmol/L (ref 98–111)
Creatinine, Ser: 2.05 mg/dL — ABNORMAL HIGH (ref 0.61–1.24)
GFR, Estimated: 33 mL/min — ABNORMAL LOW (ref >60)
Glucose, Bld: 192 mg/dL — ABNORMAL HIGH (ref 70–99)
Potassium: 4.5 mmol/L (ref 3.5–5.1)
Sodium: 132 mmol/L — ABNORMAL LOW (ref 135–145)

## 2021-02-24 LAB — URINALYSIS, ROUTINE W REFLEX MICROSCOPIC
Bilirubin Urine: NEGATIVE
Glucose, UA: 50 mg/dL — AB
Ketones, ur: NEGATIVE mg/dL
Nitrite: NEGATIVE
Protein, ur: NEGATIVE mg/dL
RBC / HPF: >50 RBC/hpf — ABNORMAL HIGH (ref 0–5)
Specific Gravity, Urine: 1.024 (ref 1.005–1.030)
WBC, UA: >50 WBC/hpf — ABNORMAL HIGH (ref 0–5)
pH: 5 (ref 5.0–8.0)

## 2021-02-24 LAB — CBC WITH DIFFERENTIAL/PLATELET
Abs Immature Granulocytes: 0.06 10*3/uL (ref 0.00–0.07)
Basophils Absolute: 0 10*3/uL (ref 0.0–0.1)
Basophils Relative: 0 %
Eosinophils Absolute: 0 10*3/uL (ref 0.0–0.5)
Eosinophils Relative: 0 %
HCT: 46 % (ref 39.0–52.0)
Hemoglobin: 15.6 g/dL (ref 13.0–17.0)
Immature Granulocytes: 1 %
Lymphocytes Relative: 12 %
Lymphs Abs: 1.5 10*3/uL (ref 0.7–4.0)
MCH: 31.9 pg (ref 26.0–34.0)
MCHC: 33.9 g/dL (ref 30.0–36.0)
MCV: 94.1 fL (ref 80.0–100.0)
Monocytes Absolute: 1.5 10*3/uL — ABNORMAL HIGH (ref 0.1–1.0)
Monocytes Relative: 12 %
Neutro Abs: 9.4 10*3/uL — ABNORMAL HIGH (ref 1.7–7.7)
Neutrophils Relative %: 75 %
Platelets: 195 10*3/uL (ref 150–400)
RBC: 4.89 MIL/uL (ref 4.22–5.81)
RDW: 12 % (ref 11.5–15.5)
WBC: 12.6 10*3/uL — ABNORMAL HIGH (ref 4.0–10.5)
nRBC: 0 % (ref 0.0–0.2)

## 2021-02-24 MED ORDER — CEPHALEXIN 500 MG PO CAPS: 500.0000 mg | ORAL_CAPSULE | Freq: Four times a day (QID) | ORAL | 0 refills | Status: DC

## 2021-02-24 MED ORDER — ONDANSETRON HCL 4 MG/2ML IJ SOLN
4.0000 mg | Freq: Once | INTRAMUSCULAR | Status: AC
  Administered 2021-02-24: 4 mg via INTRAVENOUS
  Filled 2021-02-24: qty 2

## 2021-02-24 MED ORDER — SODIUM CHLORIDE 0.9 % IV BOLUS
1000.0000 mL | Freq: Once | INTRAVENOUS | Status: AC
Start: 2021-02-24 — End: 2021-02-24
  Administered 2021-02-24: 1000 mL via INTRAVENOUS

## 2021-02-24 MED ORDER — SODIUM CHLORIDE 0.9 % IV SOLN
1.0000 g | Freq: Once | INTRAVENOUS | Status: AC
  Administered 2021-02-24: 1 g via INTRAVENOUS
  Filled 2021-02-24: qty 10

## 2021-02-24 MED ORDER — SODIUM CHLORIDE 0.9 % IV BOLUS
1000.0000 mL | Freq: Once | INTRAVENOUS | Status: AC
  Administered 2021-02-24: 1000 mL via INTRAVENOUS

## 2021-02-24 MED ORDER — HYDROMORPHONE HCL 1 MG/ML IJ SOLN
1.0000 mg | Freq: Once | INTRAMUSCULAR | Status: AC
  Administered 2021-02-24: 1 mg via INTRAVENOUS
  Filled 2021-02-24: qty 1

## 2021-02-24 NOTE — ED Provider Notes (Signed)
[facility]GENC[facility]MENT Provider Note   CSN: [id number] Arrival date & time: 02/24/21  1222     History No chief complaint on file.   Tyrone Fox is a 75 y.o. male.  HPI 75 year old male presents with pain from a kidney stone.  He has been having pain in his left back for about 2 or 3 weeks.  Went to an outside hospital about 5 days ago and was diagnosed with a proximal kidney stone.  He was given Percocet.  He has also started on Flomax a couple days ago.  He has follow-up with [facility] urology tomorrow.  However he is tired of the pain and so he came to the emergency department today.  He has not taken Percocet today.  He is had on and off dry heaving though none today.  No fevers or urinary tract infection symptoms.  Past Medical History:  Diagnosis Date  . History of prostate surgery 2020  . Hypertension   . Pre-diabetes   . Stroke [facility])     Patient Active Problem List   Diagnosis Date Noted  . Leg DVT (deep venous thromboembolism), acute, left ([place]) 10/12/2020  . Special screening for malignant neoplasms, colon 07/02/2016    Past Surgical History:  Procedure Laterality Date  . CHOLECYSTECTOMY    . COLONOSCOPY N/A 03/04/2017   Procedure: COLONOSCOPY;  Surgeon: Rogene Houston, MD;  Location[facility];  Service: Endoscopy;  Laterality: N/A;  830  . FLEXIBLE BRONCHOSCOPY N/A 02/05/2017   Procedure: FLEXIBLE BRONCHOSCOPY;  Surgeon: [name], MD;  Location: [facility] ORS;  Service: Pulmonary;  Laterality: N/A;  . TONSILLECTOMY         Family History  Problem Relation Age of Onset  . Heart disease Mother   . Hyperlipidemia Mother   . Heart disease Father   . Heart disease Maternal Grandmother   . Heart attack Maternal Grandmother     Social History   Tobacco Use  . Smoking status: Never Smoker  . Smokeless tobacco: Never Used  Vaping Use  . Vaping Use: Never used  Substance Use Topics  . Alcohol use: No  . Drug  use: No    Home Medications Prior to Admission medications   Medication Sig Start Date End Date Taking? Authorizing Provider  amLODipine (NORVASC) 2.5 MG tablet TAKE 1 TABLET(2.5 MG) BY MOUTH EVERY DAY 08/21/20   [provider]  atorvastatin (LIPITOR) 40 MG tablet Take by mouth. 07/09/17   [provider]  dextromethorphan-guaiFENesin (MUCINEX DM) 30-600 MG 12hr tablet Take 1 tablet by mouth 2 (two) times daily. Patient not taking: Reported on 01/11/2021    [provider]  esomeprazole (NEXIUM) 20 MG capsule Take 40 mg by mouth daily.  Patient not taking: Reported on 01/11/2021    [provider]  fluticasone (FLONASE) 50 MCG/ACT nasal spray Place 2 sprays into both nostrils at bedtime. Patient not taking: Reported on 01/11/2021 01/08/17   [name], MD  glipiZIDE (GLUCOTROL XL) 2.5 MG 24 hr tablet Take by mouth. 05/31/20 05/31/21  [provider]  loratadine (CLARITIN) 10 MG tablet Take 10 mg by mouth daily as needed for allergies. Patient not taking: Reported on 01/11/2021    [provider]  montelukast (SINGULAIR) 10 MG tablet TAKE 1 TABLET(10 MG) BY MOUTH EVERY NIGHT 10/31/20   [provider]  Multiple Vitamins-Minerals (CENTRUM SILVER) CHEW Chew 1 tablet by mouth daily.    [provider]  omeprazole ([place])  40 MG capsule Take 40 mg by mouth daily. 11/02/20   [provider]  ondansetron (ZOFRAN ODT) 4 MG disintegrating tablet Take 1 tablet (4 mg total) by mouth every 8 (eight) hours as needed for nausea or vomiting. 02/19/21   Blake Divine, MD  oxyCODONE-acetaminophen (PERCOCET) 5-325 MG tablet Take 1 tablet by mouth every 4 (four) hours as needed for severe pain. 02/19/21 02/19/22  Blake Divine, MD  rivaroxaban (XARELTO) 20 MG TABS tablet Take 1 tablet (20 mg total) by mouth daily. PT UNSURE OF DOSE 02/19/21   Cammie Sickle, MD  tamsulosin (FLOMAX) 0.4 MG CAPS capsule Take 0.4 mg by mouth  2 (two) times daily.  Patient not taking: No sig reported    [provider]  traZODone (DESYREL) 50 MG tablet Take by mouth. 08/18/17   [provider]  VESICARE 5 MG tablet Take 5 mg by mouth daily. Patient not taking: Reported on 01/11/2021 10/22/16   [provider]    Allergies    Ceftriaxone and Penicillins  Review of Systems   Review of Systems  Constitutional: Negative for fever.  Gastrointestinal: Positive for nausea. Negative for abdominal pain.  Genitourinary: Positive for flank pain. Negative for dysuria.  Musculoskeletal: Positive for back pain.  All other systems reviewed and are negative.   Physical Exam Updated Vital Signs BP 128/87   Pulse 70   Temp 98.2 F (36.8 C) (Oral)   Resp 20   SpO2 93%   Physical Exam Vitals and nursing note reviewed.  Constitutional:      General: He is not in acute distress.    Appearance: He is well-developed. He is not ill-appearing or diaphoretic.  HENT:     Head: Normocephalic and atraumatic.     Right Ear: External ear normal.     Left Ear: External ear normal.     Nose: Nose normal.  Eyes:     General:        Right eye: No discharge.        Left eye: No discharge.  Cardiovascular:     Rate and Rhythm: Normal rate and regular rhythm.     Heart sounds: Normal heart sounds.  Pulmonary:     Effort: Pulmonary effort is normal.     Breath sounds: Normal breath sounds.  Abdominal:     Palpations: Abdomen is soft.     Tenderness: There is no abdominal tenderness. There is left CVA tenderness.  Musculoskeletal:     Cervical back: Neck supple.  Skin:    General: Skin is warm and dry.  Neurological:     Mental Status: He is alert.  Psychiatric:        Mood and Affect: Mood is not anxious.     ED Results / Procedures / Treatments   Labs (all labs ordered are listed, but only abnormal results are displayed) Labs Reviewed  BASIC METABOLIC PANEL - Abnormal; Notable for the following  components:      Result Value   Sodium 132 (*)    Glucose, Bld 192 (*)    Creatinine, Ser 2.05 (*)    Calcium 8.8 (*)    GFR, Estimated 33 (*)    All other components within normal limits  CBC WITH DIFFERENTIAL/PLATELET - Abnormal; Notable for the following components:   WBC 12.6 (*)    Neutro Abs 9.4 (*)    Monocytes Absolute 1.5 (*)    All other components within normal limits  URINALYSIS, ROUTINE W REFLEX MICROSCOPIC  EKG None  Radiology No results found.  Procedures Procedures   Medications Ordered in ED Medications  sodium chloride 0.9 % bolus 1,000 mL (0 mLs Intravenous Stopped 02/24/21 1526)  HYDROmorphone (DILAUDID) injection 1 mg (1 mg Intravenous Given 02/24/21 1411)  sodium chloride 0.9 % bolus 1,000 mL (1,000 mLs Intravenous New Bag/Given 02/24/21 1526)    ED Course  I have reviewed the triage vital signs and the nursing notes.  Pertinent labs & imaging results that were available during my care of the patient were reviewed by me and considered in my medical decision making (see chart for details).    MDM Rules/Calculators/A&P                          After dose of Dilaudid he is pain-free.  Lab work however does show a bump in his creatinine up to 2.05, up from 1.4 last week.  This is probably from dehydration given his history from patient and wife.  I discussed with Dr. Gloriann Loan, who at this point given that he has a functioning right kidney would recommend that he just get fluids and follow-up with the urologist tomorrow as scheduled.  Should be n.p.o. just in case that urologist would want to do anything but no emergent intervention needed.  Will give second bolus fluid, check urine and likely discharge.  Care to Dr. Darl Householder. Final Clinical Impression(s) / ED Diagnoses Final diagnoses:  None    Rx / DC Orders ED Discharge Orders    None       Sherwood Gambler, MD 02/24/21 1545

## 2021-02-24 NOTE — ED Notes (Signed)
Patient prompted for urine per MD request.

## 2021-02-24 NOTE — ED Triage Notes (Addendum)
Diagnosed with kidney stone 2 weeks ago by PCP.  Took Tylenol and felt better.  Pain returned last Tuesday night.  Seen at [facility] 4/19.  Has appt scheduled tomorrow at [facility] with urologist.  C/o L sided flank pain with dry heaves.  States he is able to urinate.  States he was prescribed Percocet and has been taking it some but didn't want to take too much.

## 2021-02-24 NOTE — ED Provider Notes (Signed)
  Physical Exam  BP (!) 162/104   Pulse 75   Temp 98.2 F (36.8 C) (Oral)   Resp 16   SpO2 100%   Physical Exam  ED Course/Procedures     Procedures  MDM  Care assumed at 3 PM.  Patient was diagnosed with a kidney stone and has persistent flank pain.  His recent CT showed a 6 x 3 mm stone above the left UVJ that is obstructing.  Has creatinine of 2 today.  Urology was consulted already and states that if patient's pain is under control, patient can follow-up with urology tomorrow.  He already has an appointment at [facility]  6:48 PM Pain is controlled.  Patient does have a UTI.  I am concerned for possible infected kidney stone.  I discussed case with Dr. Gloriann Loan from urology again.  He states that since patient is not running a fever and pain is under control, patient can still follow-up with urology tomorrow.  He does have a Rocephin allergy but states that he just gets stomach upset.  He was given Rocephin in the ED with no reactions and will be discharged home with Keflex      Drenda Freeze, MD 02/24/21 [phone]

## 2021-02-24 NOTE — Discharge Instructions (Addendum)
Call your urologist in the morning. Do not eat after midnight in case they want to do a procedure.   Take Keflex 4 times daily for a week  Continue taking your Percocet for pain  Return to ER if you have worse flank pain, abdominal pain, fever, vomiting.

## 2021-02-28 ENCOUNTER — Telehealth: Payer: Self-pay | Admitting: *Deleted

## 2021-02-28 ENCOUNTER — Other Ambulatory Visit: Payer: Self-pay

## 2021-02-28 ENCOUNTER — Telehealth: Payer: Self-pay | Admitting: Internal Medicine

## 2021-02-28 LAB — URINE CULTURE: Culture: 20000 — AB

## 2021-02-28 NOTE — Telephone Encounter (Signed)
Dr. B - please advise. 

## 2021-02-28 NOTE — Telephone Encounter (Signed)
On 4/28-I spoke to patient regarding his concerns for upcoming surgery for his kidney stone at [facility] hospital-tentatively plan for surgery on May 9th.  For the upcoming surgery -I think is reasonable for the patient to come off Xarelto 5 days prior to the procedure; and then go back on Xarelto 2-days after the procedure.  Patient has been on therapeutic anticoagulation for 5 months; which is a reasonable length of anticoagulation for the current DVT.  Patient is low risk for recommend short-term DVT-hold off Lovenox bridge.  However patient at high risk for long-term recurrent DVT [given prior DVT; most recent unprovoked DVT].  Patient likely need low-dose long-term anticoagulation.  He will have this urologist office fax [place] hematology clearance if needed.   GB

## 2021-02-28 NOTE — Telephone Encounter (Signed)
Patient called reporting that he has a kidney stone and his Urologist wants to take him off his Xarelto so that he can have his kidney stone removed. He is asking if Dr B is in agreement. Please advise

## 2021-03-01 ENCOUNTER — Telehealth: Payer: Self-pay | Admitting: *Deleted

## 2021-03-01 NOTE — Telephone Encounter (Signed)
Patient called stating that his Urologist Dr Rosana Hoes needs a letter faxed from Dr B that patient can come off his Xarelto for him to have a procedure done. Fax # [phone]. Patient asking for a call to let him know when this is doen

## 2021-03-01 NOTE — Telephone Encounter (Signed)
Will send MD's clearance to [facility] - per Dr. Rogue Bussing - patient to come off Xarelto 5 days prior to the procedure; and then go back on Xarelto 2-days after the procedure.

## 2021-03-05 ENCOUNTER — Encounter (INDEPENDENT_AMBULATORY_CARE_PROVIDER_SITE_OTHER): Payer: Self-pay | Admitting: Vascular Surgery

## 2021-03-05 ENCOUNTER — Other Ambulatory Visit: Payer: Self-pay

## 2021-03-05 ENCOUNTER — Ambulatory Visit (INDEPENDENT_AMBULATORY_CARE_PROVIDER_SITE_OTHER): Payer: Medicare Other | Admitting: Vascular Surgery

## 2021-03-05 DIAGNOSIS — I10 Essential (primary) hypertension: Secondary | ICD-10-CM | POA: Diagnosis not present

## 2021-03-05 DIAGNOSIS — I825Y3 Chronic embolism and thrombosis of unspecified deep veins of proximal lower extremity, bilateral: Secondary | ICD-10-CM

## 2021-03-05 DIAGNOSIS — I2699 Other pulmonary embolism without acute cor pulmonale: Secondary | ICD-10-CM | POA: Diagnosis not present

## 2021-03-05 DIAGNOSIS — I82409 Acute embolism and thrombosis of unspecified deep veins of unspecified lower extremity: Secondary | ICD-10-CM | POA: Insufficient documentation

## 2021-03-05 NOTE — Assessment & Plan Note (Signed)
The patient has an incidental finding on a CT scan of a right lower lobe pulmonary embolus.  This likely occurred with his lower extremity DVT in the past, either the right or the left as he has had one on each side.  This is not causing him any symptoms.  No specific therapy is required for this other than continued anticoagulation which he is already on.  His oncologist/hematologist has already told him he should probably remain on anticoagulation indefinitely for a second unprovoked DVT which I would agree with completely.  I do not have a lot to add in the situation and just plan to see the patient back on an as-needed basis at this point.

## 2021-03-05 NOTE — Assessment & Plan Note (Signed)
blood pressure control important in reducing the progression of atherosclerotic disease. On appropriate oral medications.  

## 2021-03-05 NOTE — Assessment & Plan Note (Signed)
The patient has had 2 previous episodes of unprovoked DVT and I would agree with maintaining anticoagulation indefinitely.

## 2021-03-05 NOTE — Progress Notes (Signed)
MRN : [id number]  Tyrone Fox is a 75 y.o. ([birth year]) male who presents with chief complaint of  Chief Complaint  Patient presents with  . Follow-up    Follow up with [facility] ED visit CT done at [facility]   .  History of Present Illness: Patient is referred from the [facility] ER Dr. Darl Householder for an incidental finding of a small right lower lobe pulmonary embolus seen on a recent CT scan of the abdomen pelvis performed for ureteral stones.  I have independently reviewed the CT scan.  This is an age-indeterminate right lower lobe pulmonary embolus.  The patient denies any chest pain or shortness of breath.  He denies any fevers, chills, or palpitations.  He reports having had a blood clot in his left leg a couple of years ago, and then having a blood clot in his right leg about 6 months ago.  He remains on Xarelto for this.  He really only has mild leg swelling at this time.  No other complaints today.  He will need to stop his Xarelto for 2 to 3 days for his upcoming urologic procedure.  Current Outpatient Medications  Medication Sig Dispense Refill  . amLODipine (NORVASC) 2.5 MG tablet TAKE 1 TABLET(2.5 MG) BY MOUTH EVERY DAY    . atorvastatin (LIPITOR) 40 MG tablet Take by mouth.    . cephALEXin (KEFLEX) 500 MG capsule Take 1 capsule (500 mg total) by mouth 4 (four) times daily. 28 capsule 0  . dextromethorphan-guaiFENesin (MUCINEX DM) 30-600 MG 12hr tablet Take 1 tablet by mouth 2 (two) times daily.    Marland Kitchen esomeprazole (NEXIUM) 20 MG capsule Take 40 mg by mouth daily.    . fluticasone (FLONASE) 50 MCG/ACT nasal spray Place 2 sprays into both nostrils at bedtime. 16 g 2  . glipiZIDE (GLUCOTROL XL) 2.5 MG 24 hr tablet Take by mouth.    . loratadine (CLARITIN) 10 MG tablet Take 10 mg by mouth daily as needed for allergies.    . montelukast (SINGULAIR) 10 MG tablet TAKE 1 TABLET(10 MG) BY MOUTH EVERY NIGHT    . Multiple Vitamins-Minerals (CENTRUM SILVER) CHEW Chew 1 tablet by mouth daily.    Marland Kitchen omeprazole  (PRILOSEC) 40 MG capsule Take 40 mg by mouth daily.    . ondansetron (ZOFRAN ODT) 4 MG disintegrating tablet Take 1 tablet (4 mg total) by mouth every 8 (eight) hours as needed for nausea or vomiting. 12 tablet 0  . ondansetron (ZOFRAN) 4 MG tablet Take by mouth.    . oxyCODONE-acetaminophen (PERCOCET) 5-325 MG tablet Take 1 tablet by mouth every 4 (four) hours as needed for severe pain. 12 tablet 0  . rivaroxaban (XARELTO) 20 MG TABS tablet Take 1 tablet (20 mg total) by mouth daily. PT UNSURE OF DOSE 60 tablet 0  . tamsulosin (FLOMAX) 0.4 MG CAPS capsule Take 0.4 mg by mouth 2 (two) times daily.    . traZODone (DESYREL) 50 MG tablet Take by mouth.    . VESICARE 5 MG tablet Take 5 mg by mouth daily.  1  . azithromycin (ZITHROMAX) 250 MG tablet Take 250 mg by mouth as directed.     No current facility-administered medications for this visit.    Past Medical History:  Diagnosis Date  . History of prostate surgery 2020  . Hypertension   . Pre-diabetes   . Stroke [facility])     Past Surgical History:  Procedure Laterality Date  . CHOLECYSTECTOMY    . COLONOSCOPY N/A  03/04/2017   Procedure: COLONOSCOPY;  Surgeon: Rogene Houston, MD;  Location: [facility];  Service: Endoscopy;  Laterality: N/A;  830  . FLEXIBLE BRONCHOSCOPY N/A 02/05/2017   Procedure: FLEXIBLE BRONCHOSCOPY;  Surgeon: [name], MD;  Location: [facility] ORS;  Service: Pulmonary;  Laterality: N/A;  . TONSILLECTOMY       Social History   Tobacco Use  . Smoking status: Never Smoker  . Smokeless tobacco: Never Used  Vaping Use  . Vaping Use: Never used  Substance Use Topics  . Alcohol use: No  . Drug use: No      Family History  Problem Relation Age of Onset  . Heart disease Mother   . Hyperlipidemia Mother   . Heart disease Father   . Heart disease Maternal Grandmother   . Heart attack Maternal Grandmother      Allergies  Allergen Reactions  . Penicillins Hives and Itching    Has patient had a PCN  reaction causing immediate rash, facial/tongue/throat swelling, SOB or lightheadedness with hypotension: {yes Has patient had a PCN reaction causing severe rash involving mucus membranes or skin necrosis: {Yno Has patient had a PCN reaction that required hospitalization no Has patient had a PCN reaction occurring within the last 10 years: {yes If all of the above answers are "NO", then may proceed with Cephalosporin use. Has patient had a PCN reaction causing immediate rash, facial/tongue/throat swelling, SOB or lightheadedness with hypotension: {yes Has patient had a PCN reaction causing severe rash involving mucus membranes or skin necrosis: {Yno Has patient had a PCN reaction that required hospitalization no Has patient had a PCN reaction occurring within the last 10 years: {yes If all of the above answers are "NO", then may proceed with Cephalosporin use. Patient took Amoxicillin 836m po bid (wife's prescription) and tolerated without any problem-04/09/2016 ([facility]) Has patient had a PCN reaction causing immediate rash, facial/tongue/throat swelling, SOB or lightheadedness with hypotension: {yes Has patient had a PCN reaction causing severe rash involving mucus membranes or skin necrosis: {Yno Has patient had a PCN reaction that required hospitalization no Has patient had a PCN reaction occurring within the last 10 years: {yes If all of the above answers are "NO", then may proceed with Cephalosporin use. Has patient had a PCN reaction causing immediate rash, facial/tongue/throat swelling, SOB or lightheadedness with hypotension: {yes Has patient had a PCN reaction causing severe rash involving mucus membranes or skin necrosis: {Yno Has patient had a PCN reaction that required hospitalization no Has patient had a PCN reaction occurring within the last 10 years: {yes If all of the above answers are "NO", then may proceed with Cephalosporin use.      REVIEW OF SYSTEMS (Negative unless  checked)  Constitutional: _0 Weight loss  _1 Fever  _2 Chills Cardiac: _3 Chest pain   _4 Chest pressure   _5 Palpitations   _6 Shortness of breath when laying flat   _7 Shortness of breath at rest   _8 Shortness of breath with exertion. Vascular:  _9 Pain in legs with walking   _10 Pain in legs at rest   _11 Pain in legs when laying flat   _12 Claudication   _13 Pain in feet when walking  _14 Pain in feet at rest  _15 Pain in feet when laying flat   _16 History of DVT   _17 Phlebitis   _18 Swelling in legs   _19 Varicose veins   _20 Non-healing ulcers Pulmonary:   _21 Uses home oxygen   _22 Productive cough   _23 Hemoptysis   _24 Wheeze  _25 COPD   _26 Asthma Neurologic:  _27 Dizziness  _28 Blackouts   _29   Seizures   _0 History of stroke   _1 History of TIA  _2 Aphasia   _3 Temporary blindness   _4 Dysphagia   _5 Weakness or numbness in arms   _6 Weakness or numbness in legs Musculoskeletal:  _7 Arthritis   _8 Joint swelling   _9 Joint pain   _10 Low back pain Hematologic:  _11 Easy bruising  _12 Easy bleeding   _13 Hypercoagulable state   _14 Anemic   Gastrointestinal:  _15 Blood in stool   _16 Vomiting blood  _17 Gastroesophageal reflux/heartburn   _18 Abdominal pain Genitourinary:  _19 Chronic kidney disease   _20 Difficult urination  _21 Frequent urination  _22 Burning with urination   _23 Hematuria Skin:  _24 Rashes   _25 Ulcers   _26 Wounds Psychological:  _27 History of anxiety   _28  History of major depression.  Physical Examination  BP 106/71   Pulse 94   Ht _29  (1.702 m)   Wt 209 lb (94.8 kg)   BMI 32.73 kg/m  Gen:  WD/WN, NAD Head: [place]/AT, No temporalis wasting. Ear/Nose/Throat: Hearing grossly intact, nares w/o erythema or drainage Eyes: Conjunctiva clear. Sclera non-icteric Neck: Supple.  Trachea midline Pulmonary:  Good air movement, no use of accessory muscles.  Cardiac: RRR, no JVD Vascular:  Vessel Right Left  Radial Palpable Palpable                          PT Palpable Palpable  DP Palpable Palpable   Gastrointestinal: soft,  non-tender/non-distended. No guarding/reflex.  Musculoskeletal: M/S 5/5 throughout.  No deformity or atrophy.  Trace lower extremity edema. Neurologic: Sensation grossly intact in extremities.  Symmetrical.  Speech is good but occasionally misses words Psychiatric: Judgment intact, Mood & affect appropriate for pt's clinical situation. Dermatologic: No rashes or ulcers noted.  No cellulitis or open wounds.       Labs Recent Results (from the past 2160 hour(s))  Basic metabolic panel     Status: Abnormal   Collection Time: 01/11/21 10:03 AM  Result Value Ref Range   Sodium 136 135 - 145 mmol/L   Potassium 3.8 3.5 - 5.1 mmol/L   Chloride 103 98 - 111 mmol/L   CO2 26 22 - 32 mmol/L   Glucose, Bld 245 (H) 70 - 99 mg/dL    Comment: Glucose reference range applies only to samples taken after fasting for at least 8 hours.   BUN 12 8 - 23 mg/dL   Creatinine, Ser 1.21 0.61 - 1.24 mg/dL   Calcium 9.2 8.9 - 10.3 mg/dL   GFR, Estimated >60 >60 mL/min    Comment: (NOTE) Calculated using the CKD-EPI Creatinine Equation (2021)    Anion gap 7 5 - 15    Comment: Performed at [facility], [address] [place], [place] [address]  CBC with Differential     Status: None   Collection Time: 01/11/21 10:03 AM  Result Value Ref Range   WBC 8.9 4.0 - 10.5 K/uL   RBC 5.29 4.22 - 5.81 MIL/uL   Hemoglobin 16.9 13.0 - 17.0 g/dL   HCT 49.3 39.0 - 52.0 %   MCV 93.2 80.0 - 100.0 fL   MCH 31.9 26.0 - 34.0 pg   MCHC 34.3 30.0 - 36.0 g/dL   RDW 12.8 11.5 - 15.5 %   Platelets 256 150 - 400 K/uL   nRBC 0.0 0.0 - 0.2 %   Neutrophils Relative % 72 %   Neutro Abs 6.4 1.7 - 7.7 K/uL   Lymphocytes Relative 18 %   Lymphs Abs 1.6 0.7 - 4.0 K/uL   Monocytes  Relative 8 %   Monocytes Absolute 0.7 0.1 - 1.0 K/uL   Eosinophils Relative 1 %   Eosinophils Absolute 0.1 0.0 - 0.5 K/uL   Basophils Relative 1 %   Basophils Absolute 0.0 0.0 - 0.1 K/uL   Immature Granulocytes 0 %   Abs Immature  Granulocytes 0.02 0.00 - 0.07 K/uL    Comment: Performed at [facility], [address] [place], [place] [address]  Urinalysis, Complete w Microscopic Urine, Clean Catch     Status: Abnormal   Collection Time: 02/19/21  8:15 AM  Result Value Ref Range   Color, Urine YELLOW (A) YELLOW   APPearance CLEAR (A) CLEAR   Specific Gravity, Urine 1.021 1.005 - 1.030   pH 6.0 5.0 - 8.0   Glucose, UA >=500 (A) NEGATIVE mg/dL   Hgb urine dipstick SMALL (A) NEGATIVE   Bilirubin Urine NEGATIVE NEGATIVE   Ketones, ur 5 (A) NEGATIVE mg/dL   Protein, ur 30 (A) NEGATIVE mg/dL   Nitrite NEGATIVE NEGATIVE   Leukocytes,Ua NEGATIVE NEGATIVE   RBC / HPF 21-50 0 - 5 RBC/hpf   WBC, UA 0-5 0 - 5 WBC/hpf   Bacteria, UA NONE SEEN NONE SEEN   Squamous Epithelial / LPF 0-5 0 - 5   Mucus PRESENT     Comment: Performed at [facility], [address] [place], [place] [address]  CBC with Differential     Status: Abnormal   Collection Time: 02/19/21  8:27 AM  Result Value Ref Range   WBC 14.8 (H) 4.0 - 10.5 K/uL   RBC 5.32 4.22 - 5.81 MIL/uL   Hemoglobin 16.9 13.0 - 17.0 g/dL   HCT 48.2 39.0 - 52.0 %   MCV 90.6 80.0 - 100.0 fL   MCH 31.8 26.0 - 34.0 pg   MCHC 35.1 30.0 - 36.0 g/dL   RDW 12.4 11.5 - 15.5 %   Platelets 235 150 - 400 K/uL   nRBC 0.0 0.0 - 0.2 %   Neutrophils Relative % 83 %   Neutro Abs 12.3 (H) 1.7 - 7.7 K/uL   Lymphocytes Relative 8 %   Lymphs Abs 1.1 0.7 - 4.0 K/uL   Monocytes Relative 8 %   Monocytes Absolute 1.1 (H) 0.1 - 1.0 K/uL   Eosinophils Relative 0 %   Eosinophils Absolute 0.1 0.0 - 0.5 K/uL   Basophils Relative 0 %   Basophils Absolute 0.0 0.0 - 0.1 K/uL   Immature Granulocytes 1 %   Abs Immature Granulocytes 0.08 (H) 0.00 - 0.07 K/uL    Comment: Performed at [facility], [address] [place], [place] [address]  Comprehensive metabolic panel     Status: Abnormal   Collection Time: 02/19/21  8:27 AM  Result Value Ref Range   Sodium 137 135  - 145 mmol/L   Potassium 4.2 3.5 - 5.1 mmol/L   Chloride 105 98 - 111 mmol/L   CO2 23 22 - 32 mmol/L   Glucose, Bld 259 (H) 70 - 99 mg/dL    Comment: Glucose reference range applies only to samples taken after fasting for at least 8 hours.   BUN 14 8 - 23 mg/dL   Creatinine, Ser 1.46 (H) 0.61 - 1.24 mg/dL   Calcium 9.2 8.9 - 10.3 mg/dL   Total Protein 7.8 6.5 - 8.1 g/dL   Albumin 3.9 3.5 - 5.0 g/dL   AST 25 15 - 41 U/L   ALT 29 0 - 44 U/L   Alkaline Phosphatase  49 38 - 126 U/L   Total Bilirubin 3.4 (H) 0.3 - 1.2 mg/dL   GFR, Estimated 50 (L) >60 mL/min    Comment: (NOTE) Calculated using the CKD-EPI Creatinine Equation (2021)    Anion gap 9 5 - 15    Comment: Performed at [facility], [address] [place], [place] [address]  Lipase, blood     Status: None   Collection Time: 02/19/21  8:27 AM  Result Value Ref Range   Lipase 25 11 - 51 U/L    Comment: Performed at [facility], [place], [place] [address]  Troponin I (High Sensitivity)     Status: None   Collection Time: 02/19/21  8:27 AM  Result Value Ref Range   Troponin I (High Sensitivity) 4 <18 ng/L    Comment: (NOTE) Elevated high sensitivity troponin I (hsTnI) values and significant  changes across serial measurements may suggest ACS but many other  chronic and acute conditions are known to elevate hsTnI results.  Refer to the "Links" section for chest pain algorithms and additional  guidance. Performed at [facility], [address] [place], [place] [address]   Urinalysis, Routine w reflex microscopic Urine, Clean Catch     Status: Abnormal   Collection Time: 02/24/21  1:47 PM  Result Value Ref Range   Color, Urine AMBER (A) YELLOW    Comment: BIOCHEMICALS MAY BE AFFECTED BY COLOR   APPearance CLOUDY (A) CLEAR   Specific Gravity, Urine 1.024 1.005 - 1.030   pH 5.0 5.0 - 8.0   Glucose, UA 50 (A) NEGATIVE mg/dL   Hgb urine dipstick LARGE (A) NEGATIVE   Bilirubin  Urine NEGATIVE NEGATIVE   Ketones, ur NEGATIVE NEGATIVE mg/dL   Protein, ur NEGATIVE NEGATIVE mg/dL   Nitrite NEGATIVE NEGATIVE   Leukocytes,Ua MODERATE (A) NEGATIVE   RBC / HPF >50 (H) 0 - 5 RBC/hpf   WBC, UA >50 (H) 0 - 5 WBC/hpf   Bacteria, UA RARE (A) NONE SEEN   Squamous Epithelial / LPF 0-5 0 - 5   Mucus PRESENT    Non Squamous Epithelial 0-5 (A) NONE SEEN    Comment: Performed at [facility], [place] [address], [place], [place] [address]  Urine Culture     Status: Abnormal   Collection Time: 02/24/21  1:47 PM   Specimen: Urine, Random  Result Value Ref Range   Specimen Description URINE, RANDOM    Special Requests      NONE Performed at [facility], [place] [address], [place], [place] [address]    Culture 20,000 COLONIES/mL ENTEROCOCCUS FAECALIS (A)    Report Status 02/28/2021 FINAL    Organism ID, Bacteria ENTEROCOCCUS FAECALIS (A)       Susceptibility   Enterococcus faecalis - MIC*    AMPICILLIN <=2 SENSITIVE Sensitive     NITROFURANTOIN <=16 SENSITIVE Sensitive     VANCOMYCIN 1 SENSITIVE Sensitive     * 20,000 COLONIES/mL ENTEROCOCCUS FAECALIS  Basic metabolic panel     Status: Abnormal   Collection Time: 02/24/21  2:05 PM  Result Value Ref Range   Sodium 132 (L) 135 - 145 mmol/L   Potassium 4.5 3.5 - 5.1 mmol/L   Chloride 100 98 - 111 mmol/L   CO2 23 22 - 32 mmol/L   Glucose, Bld 192 (H) 70 - 99 mg/dL    Comment: Glucose reference range applies only to samples taken after fasting for at least 8 hours.  BUN 20 8 - 23 mg/dL   Creatinine, Ser 2.05 (H) 0.61 - 1.24 mg/dL   Calcium 8.8 (L) 8.9 - 10.3 mg/dL   GFR, Estimated 33 (L) >60 mL/min    Comment: (NOTE) Calculated using the CKD-EPI Creatinine Equation (2021)    Anion gap 9 5 - 15    Comment: Performed at [facility] [address], [place], [place] [address]  CBC with Differential     Status: Abnormal   Collection Time: 02/24/21  2:05 PM  Result Value Ref Range   WBC 12.6 (H) 4.0 - 10.5  K/uL   RBC 4.89 4.22 - 5.81 MIL/uL   Hemoglobin 15.6 13.0 - 17.0 g/dL   HCT 46.0 39.0 - 52.0 %   MCV 94.1 80.0 - 100.0 fL   MCH 31.9 26.0 - 34.0 pg   MCHC 33.9 30.0 - 36.0 g/dL   RDW 12.0 11.5 - 15.5 %   Platelets 195 150 - 400 K/uL   nRBC 0.0 0.0 - 0.2 %   Neutrophils Relative % 75 %   Neutro Abs 9.4 (H) 1.7 - 7.7 K/uL   Lymphocytes Relative 12 %   Lymphs Abs 1.5 0.7 - 4.0 K/uL   Monocytes Relative 12 %   Monocytes Absolute 1.5 (H) 0.1 - 1.0 K/uL   Eosinophils Relative 0 %   Eosinophils Absolute 0.0 0.0 - 0.5 K/uL   Basophils Relative 0 %   Basophils Absolute 0.0 0.0 - 0.1 K/uL   Immature Granulocytes 1 %   Abs Immature Granulocytes 0.06 0.00 - 0.07 K/uL    Comment: Performed at [facility] [address], [place], [place] [address]    Radiology CT Abdomen Pelvis W Contrast  Result Date: 02/19/2021 CLINICAL DATA:  Acute nonlocalized abdominal pain EXAM: CT ABDOMEN AND PELVIS WITH CONTRAST TECHNIQUE: Multidetector CT imaging of the abdomen and pelvis was performed using the standard protocol following bolus administration of intravenous contrast. CONTRAST:  143m OMNIPAQUE IOHEXOL 300 MG/ML  SOLN COMPARISON:  None. FINDINGS: Lower chest: Right lower lobe pulmonary artery filling defect at the lobar to segmental level. Proximally the clot has a web-like appearance but at the posterior basal segment there is a more central and rounded appearance. Hepatobiliary: Steatotic appearance of the liver.Absent gallbladder with normal common bile duct dimensions. Pancreas: Unremarkable. Spleen: Unremarkable. Adrenals/Urinary Tract: Negative adrenals. Left hydroureteronephrosis to the level of a distal ureteral calculus just above the UVJ. The stone measures 6 x 3 mm. Delayed left renal enhancement. Small renal cystic densities. Unremarkable bladder. Stomach/Bowel:  No obstruction. No detected bowel wall thickening. Vascular/Lymphatic: No acute vascular abnormality. Mild atherosclerosis for  age. No mass or adenopathy. Reproductive:Prostatectomy. Low-density in the operative region is likely descent of the bladder neck. Other: No ascites or pneumoperitoneum.  Fatty umbilical hernia. Musculoskeletal: No acute abnormalities. Critical Value/emergent results were called by telephone at the time of interpretation on 02/19/2021 at 9:48 am [facility] who verbally acknowledged these results. IMPRESSION: 1. Age-indeterminate pulmonary embolism in the right lower lobe that is lobar to segmental in size. 2. Obstructing 6 x 3 mm stone just above the left UVJ. 3. Chronic findings are described above. Electronically Signed   By: JMonte FantasiaM.D.   On: 02/19/2021 09:51    Assessment/Plan  Pulmonary embolus ([facility] The patient has an incidental finding on a CT scan of a right lower lobe pulmonary embolus.  This likely occurred with his lower extremity DVT in the past, either the  right or the left as he has had one on each side.  This is not causing him any symptoms.  No specific therapy is required for this other than continued anticoagulation which he is already on.  His oncologist/hematologist has already told him he should probably remain on anticoagulation indefinitely for a second unprovoked DVT which I would agree with completely.  I do not have a lot to add in the situation and just plan to see the patient back on an as-needed basis at this point.  DVT (deep venous thrombosis) ([place]) The patient has had 2 previous episodes of unprovoked DVT and I would agree with maintaining anticoagulation indefinitely.  Essential hypertension, benign blood pressure control important in reducing the progression of atherosclerotic disease. On appropriate oral medications.     Leotis Pain, MD  03/05/2021 12:24 PM    This note was created with Dragon medical transcription system.  Any errors from dictation are purely unintentional

## 2021-04-11 ENCOUNTER — Other Ambulatory Visit: Payer: Self-pay

## 2021-04-11 DIAGNOSIS — I82402 Acute embolism and thrombosis of unspecified deep veins of left lower extremity: Secondary | ICD-10-CM

## 2021-04-12 ENCOUNTER — Other Ambulatory Visit: Payer: Self-pay

## 2021-04-12 ENCOUNTER — Encounter: Payer: Self-pay | Admitting: Internal Medicine

## 2021-04-12 ENCOUNTER — Inpatient Hospital Stay (HOSPITAL_BASED_OUTPATIENT_CLINIC_OR_DEPARTMENT_OTHER): Payer: Medicare Other | Admitting: Internal Medicine

## 2021-04-12 ENCOUNTER — Other Ambulatory Visit (HOSPITAL_COMMUNITY): Payer: Self-pay

## 2021-04-12 ENCOUNTER — Inpatient Hospital Stay: Payer: Medicare Other | Attending: Internal Medicine

## 2021-04-12 VITALS — BP 119/88 | HR 82 | Temp 98.0°F | Resp 20 | Wt 208.0 lb

## 2021-04-12 DIAGNOSIS — I82402 Acute embolism and thrombosis of unspecified deep veins of left lower extremity: Secondary | ICD-10-CM | POA: Diagnosis present

## 2021-04-12 DIAGNOSIS — Z7901 Long term (current) use of anticoagulants: Secondary | ICD-10-CM | POA: Diagnosis not present

## 2021-04-12 LAB — CBC WITH DIFFERENTIAL/PLATELET
Abs Immature Granulocytes: 0.03 10*3/uL (ref 0.00–0.07)
Basophils Absolute: 0 10*3/uL (ref 0.0–0.1)
Basophils Relative: 0 %
Eosinophils Absolute: 0.1 10*3/uL (ref 0.0–0.5)
Eosinophils Relative: 1 %
HCT: 45.4 % (ref 39.0–52.0)
Hemoglobin: 15.9 g/dL (ref 13.0–17.0)
Immature Granulocytes: 0 %
Lymphocytes Relative: 21 %
Lymphs Abs: 1.7 10*3/uL (ref 0.7–4.0)
MCH: 32.3 pg (ref 26.0–34.0)
MCHC: 35 g/dL (ref 30.0–36.0)
MCV: 92.1 fL (ref 80.0–100.0)
Monocytes Absolute: 0.8 10*3/uL (ref 0.1–1.0)
Monocytes Relative: 9 %
Neutro Abs: 5.8 10*3/uL (ref 1.7–7.7)
Neutrophils Relative %: 69 %
Platelets: 234 10*3/uL (ref 150–400)
RBC: 4.93 MIL/uL (ref 4.22–5.81)
RDW: 12.4 % (ref 11.5–15.5)
WBC: 8.5 10*3/uL (ref 4.0–10.5)
nRBC: 0 % (ref 0.0–0.2)

## 2021-04-12 LAB — BASIC METABOLIC PANEL
Anion gap: 11 (ref 5–15)
BUN: 14 mg/dL (ref 8–23)
CO2: 22 mmol/L (ref 22–32)
Calcium: 9.3 mg/dL (ref 8.9–10.3)
Chloride: 104 mmol/L (ref 98–111)
Creatinine, Ser: 1.12 mg/dL (ref 0.61–1.24)
GFR, Estimated: >60 mL/min (ref >60)
Glucose, Bld: 271 mg/dL — ABNORMAL HIGH (ref 70–99)
Potassium: 3.8 mmol/L (ref 3.5–5.1)
Sodium: 137 mmol/L (ref 135–145)

## 2021-04-12 LAB — D-DIMER, QUANTITATIVE: D-Dimer, Quant: 0.28 ug/mL-FEU (ref 0.00–0.50)

## 2021-04-12 MED ORDER — APIXABAN 2.5 MG PO TABS: 2.5000 mg | ORAL_TABLET | Freq: Two times a day (BID) | ORAL | 1 refills | Status: DC

## 2021-04-12 NOTE — Assessment & Plan Note (Addendum)
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  Given recurrent/unprovoked DVT-recommend Eliquis 2.5 mg twice daily for 6 more months/for longer.  # ETIOLOGY: No clear etiology noted; prior right lower extremity DVT.  Factor V Leiden/prothrombin gene mutation-negative.   # DISPOSITION: # follow up in  6 months-MD; labs- cbc/bmp/d-dimer- Dr.B

## 2021-04-12 NOTE — Progress Notes (Signed)
[name] NOTE  Patient Care Team: [name], MD as PCP - General (Family Medicine)  CHIEF COMPLAINTS/PURPOSE OF CONSULTATION: DVT   # RIGHT LE DV-[? 2017-2018] [ on xarelto]; ? Stroke [in [place]]x ? Duration; STOPPED xarelto.   # DEC 2nd 2021- LEFT LOWER EXTREMITY Acute deep venous thrombosis involving the mid to distal LEFT femoral vein, LEFT popliteal vein and extending into LEFT posterior tibial vein-on xarelto ; Family history: son had blood clot [group home]-factor V Leiden/prothrombin gene mutation negative  # PROSTATE CANCER [At [facility]- incidental; Dr.Davis]-    Oncology History   No history exists.     HISTORY OF PRESENTING ILLNESS:  Tyrone Fox 75 y.o.  male patient  uprovoked left lower extremity DVT/recurrent-currently on Xarelto stable follow-up.  Patient denies any worsening swelling of the legs.  Denies any blood clots.  Denies any blood in stools or black-colored stools or bleeding episodes.   Review of Systems  Constitutional:  Negative for chills, diaphoresis, fever, malaise/fatigue and weight loss.  HENT:  Negative for nosebleeds and sore throat.   Eyes:  Negative for double vision.  Respiratory:  Negative for cough, hemoptysis, sputum production, shortness of breath and wheezing.   Cardiovascular:  Negative for chest pain, palpitations, orthopnea and leg swelling.  Gastrointestinal:  Negative for abdominal pain, blood in stool, constipation, diarrhea, heartburn, melena, nausea and vomiting.  Genitourinary:  Negative for dysuria, frequency and urgency.  Musculoskeletal:  Negative for back pain and joint pain.  Skin: Negative.  Negative for itching and rash.  Neurological:  Negative for dizziness, tingling, focal weakness, weakness and headaches.  Endo/Heme/Allergies:  Does not bruise/bleed easily.  Psychiatric/Behavioral:  Negative for depression. The patient is not nervous/anxious and does not have insomnia.     MEDICAL  HISTORY:  Past Medical History:  Diagnosis Date   History of prostate surgery 2020   Hypertension    Pre-diabetes    Stroke [facility])     SURGICAL HISTORY: Past Surgical History:  Procedure Laterality Date   CHOLECYSTECTOMY     COLONOSCOPY N/A 03/04/2017   Procedure: COLONOSCOPY;  Surgeon: Rogene Houston, MD;  [facility];  Service: Endoscopy;  Laterality: N/A;  [place] N/A 02/05/2017   Procedure: FLEXIBLE BRONCHOSCOPY;  Surgeon: [name], MD;  Location: [facility] ORS;  Service: Pulmonary;  Laterality: N/A;   TONSILLECTOMY      SOCIAL HISTORY: Social History   Socioeconomic History   Marital status: Married    Spouse name: Not on file   Number of children: Not on file   Years of education: Not on file   Highest education level: Not on file  Occupational History   Not on file  Tobacco Use   Smoking status: Never   Smokeless tobacco: Never  Vaping Use   Vaping Use: Never used  Substance and Sexual Activity   Alcohol use: No   Drug use: No   Sexual activity: Not on file  Other Topics Concern   Not on file  Social History Narrative   Lives in [place]; with wife; never smoked; 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    Social Determinants of Health   Financial Resource Strain: Not on file  Food Insecurity: Not on file  Transportation Needs: Not on file  Physical Activity: Not on file  Stress: Not on file  Social Connections: Not on file  Intimate Partner Violence: Not on file    FAMILY HISTORY: Family History  Problem Relation Age of  Onset   Heart disease Mother    Hyperlipidemia Mother    Heart disease Father    Heart disease Maternal Grandmother    Heart attack Maternal Grandmother     ALLERGIES:  is allergic to ciprofloxacin and penicillins.  MEDICATIONS:  Current Outpatient Medications  Medication Sig Dispense Refill   amLODipine (NORVASC) 2.5 MG tablet TAKE 1 TABLET(2.5 MG) BY MOUTH EVERY DAY     apixaban (ELIQUIS) 2.5 MG  TABS tablet Take 1 tablet (2.5 mg total) by mouth 2 (two) times daily. 180 tablet 1   atorvastatin (LIPITOR) 40 MG tablet Take by mouth.     glipiZIDE (GLUCOTROL XL) 2.5 MG 24 hr tablet Take by mouth.     montelukast (SINGULAIR) 10 MG tablet TAKE 1 TABLET(10 MG) BY MOUTH EVERY NIGHT     Multiple Vitamins-Minerals (CENTRUM SILVER) CHEW Chew 1 tablet by mouth daily.     omeprazole (PRILOSEC) 40 MG capsule Take 40 mg by mouth daily.     rivaroxaban (XARELTO) 20 MG TABS tablet Take 1 tablet (20 mg total) by mouth daily. PT UNSURE OF DOSE 60 tablet 0   tamsulosin (FLOMAX) 0.4 MG CAPS capsule Take 0.4 mg by mouth 2 (two) times daily.     traZODone (DESYREL) 50 MG tablet Take by mouth.     azithromycin (ZITHROMAX) 250 MG tablet Take 250 mg by mouth as directed. (Patient not taking: Reported on 04/12/2021)     cephALEXin (KEFLEX) 500 MG capsule Take 1 capsule (500 mg total) by mouth 4 (four) times daily. (Patient not taking: Reported on 04/12/2021) 28 capsule 0   dextromethorphan-guaiFENesin (MUCINEX DM) 30-600 MG 12hr tablet Take 1 tablet by mouth 2 (two) times daily. (Patient not taking: Reported on 04/12/2021)     esomeprazole (NEXIUM) 20 MG capsule Take 40 mg by mouth daily. (Patient not taking: Reported on 04/12/2021)     fluticasone (FLONASE) 50 MCG/ACT nasal spray Place 2 sprays into both nostrils at bedtime. (Patient not taking: Reported on 04/12/2021) 16 g 2   loratadine (CLARITIN) 10 MG tablet Take 10 mg by mouth daily as needed for allergies. (Patient not taking: Reported on 04/12/2021)     ondansetron (ZOFRAN ODT) 4 MG disintegrating tablet Take 1 tablet (4 mg total) by mouth every 8 (eight) hours as needed for nausea or vomiting. (Patient not taking: Reported on 04/12/2021) 12 tablet 0   ondansetron (ZOFRAN) 4 MG tablet Take by mouth. (Patient not taking: Reported on 04/12/2021)     oxyCODONE-acetaminophen (PERCOCET) 5-325 MG tablet Take 1 tablet by mouth every 4 (four) hours as needed for severe pain.  (Patient not taking: Reported on 04/12/2021) 12 tablet 0   VESICARE 5 MG tablet Take 5 mg by mouth daily. (Patient not taking: Reported on 04/12/2021)  1   No current facility-administered medications for this visit.      Marland Kitchen  PHYSICAL EXAMINATION:  Vitals:   04/12/21 1021  BP: 119/88  Pulse: 82  Resp: 20  Temp: 98 F (36.7 C)  SpO2: 97%   Filed Weights   04/12/21 1021  Weight: 208 lb (94.3 kg)    Physical Exam HENT:     Head: Normocephalic and atraumatic.     Mouth/Throat:     Pharynx: No oropharyngeal exudate.  Eyes:     Pupils: Pupils are equal, round, and reactive to light.  Cardiovascular:     Rate and Rhythm: Normal rate and regular rhythm.  Pulmonary:     Effort: Pulmonary effort is normal. No  respiratory distress.     Breath sounds: Normal breath sounds. No wheezing.  Abdominal:     General: Bowel sounds are normal. There is no distension.     Palpations: Abdomen is soft. There is no mass.     Tenderness: no abdominal tenderness There is no guarding or rebound.  Musculoskeletal:        General: No tenderness. Normal range of motion.     Cervical back: Normal range of motion and neck supple.  Skin:    General: Skin is warm.  Neurological:     Mental Status: He is alert and oriented to person, place, and time.  Psychiatric:        Mood and Affect: Affect normal.     LABORATORY DATA:  I have reviewed the data as listed Lab Results  Component Value Date   WBC 8.5 04/12/2021   HGB 15.9 04/12/2021   HCT 45.4 04/12/2021   MCV 92.1 04/12/2021   PLT 234 04/12/2021   Recent Labs    10/12/20 1225 01/11/21 1003 02/19/21 0827 02/24/21 1405 04/12/21 1007  NA 139   < > 137 132* 137  K 4.6   < > 4.2 4.5 3.8  CL 103   < > 105 100 104  CO2 27   < > _0 GLUCOSE 176*   < > 259* 192* 271*  BUN 14   < > _1 CREATININE 1.10   < > 1.46* 2.05* 1.12  CALCIUM 9.3   < > 9.2 8.8* 9.3  GFRNONAA >60   < > 50* 33* >60  PROT 8.1  --  7.8  --   --    ALBUMIN 3.9  --  3.9  --   --   AST 29  --  25  --   --   ALT 27  --  29  --   --   ALKPHOS 53  --  49  --   --   BILITOT 1.8*  --  3.4*  --   --    < > = values in this interval not displayed.    RADIOGRAPHIC STUDIES: I have personally reviewed the radiological images as listed and agreed with the findings in the report. No results found.  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  Given recurrent/unprovoked DVT-recommend Eliquis 2.5 mg twice daily for 6 more months/for longer.  # ETIOLOGY: No clear etiology noted; prior right lower extremity DVT.  Factor V Leiden/prothrombin gene mutation-negative.   # DISPOSITION: # follow up in  6 months-MD; labs- cbc/bmp/d-dimer- Dr.B  All questions were answered. The patient knows to call the clinic with any problems, questions or concerns.    Cammie Sickle, MD 04/18/2021 9:37 PM

## 2021-07-11 ENCOUNTER — Other Ambulatory Visit (HOSPITAL_COMMUNITY): Payer: Self-pay

## 2021-07-11 ENCOUNTER — Telehealth: Payer: Self-pay | Admitting: *Deleted

## 2021-07-11 NOTE — Telephone Encounter (Signed)
Patient called stating that the price of Eliquis has gone way up and is asking if there is something else he can be ordered that is cheaper

## 2021-07-11 NOTE — Telephone Encounter (Signed)
Judy/ Dr. Jacinto Reap- please advise.

## 2021-07-15 NOTE — Telephone Encounter (Signed)
Bethena Roys- let's start with manufacturer assistance. If he is approved, then Dr. B would just keep him on the Eliquis. If he doesn't get approved for assistance, please let us know as soon as possible.

## 2021-08-19 DIAGNOSIS — Z23 Encounter for immunization: Secondary | ICD-10-CM | POA: Diagnosis not present

## 2021-10-09 ENCOUNTER — Other Ambulatory Visit: Payer: Self-pay | Admitting: *Deleted

## 2021-10-09 DIAGNOSIS — I82402 Acute embolism and thrombosis of unspecified deep veins of left lower extremity: Secondary | ICD-10-CM

## 2021-10-11 ENCOUNTER — Inpatient Hospital Stay: Payer: Medicare Other | Attending: Internal Medicine

## 2021-10-11 ENCOUNTER — Encounter: Payer: Self-pay | Admitting: Internal Medicine

## 2021-10-11 ENCOUNTER — Other Ambulatory Visit: Payer: Self-pay

## 2021-10-11 ENCOUNTER — Inpatient Hospital Stay: Payer: Medicare Other | Admitting: Internal Medicine

## 2021-10-11 VITALS — BP 112/78 | HR 84 | Temp 97.7°F | Resp 16 | Wt 213.0 lb

## 2021-10-11 DIAGNOSIS — Z7901 Long term (current) use of anticoagulants: Secondary | ICD-10-CM | POA: Diagnosis not present

## 2021-10-11 DIAGNOSIS — I82402 Acute embolism and thrombosis of unspecified deep veins of left lower extremity: Secondary | ICD-10-CM

## 2021-10-11 DIAGNOSIS — R200 Anesthesia of skin: Secondary | ICD-10-CM | POA: Diagnosis not present

## 2021-10-11 LAB — BASIC METABOLIC PANEL
Anion gap: 8 (ref 5–15)
BUN: 14 mg/dL (ref 8–23)
CO2: 26 mmol/L (ref 22–32)
Calcium: 9.1 mg/dL (ref 8.9–10.3)
Chloride: 101 mmol/L (ref 98–111)
Creatinine, Ser: 1.2 mg/dL (ref 0.61–1.24)
GFR, Estimated: >60 mL/min (ref >60)
Glucose, Bld: 216 mg/dL — ABNORMAL HIGH (ref 70–99)
Potassium: 4.1 mmol/L (ref 3.5–5.1)
Sodium: 135 mmol/L (ref 135–145)

## 2021-10-11 LAB — CBC WITH DIFFERENTIAL/PLATELET
Abs Immature Granulocytes: 0.03 10*3/uL (ref 0.00–0.07)
Basophils Absolute: 0 10*3/uL (ref 0.0–0.1)
Basophils Relative: 0 %
Eosinophils Absolute: 0.2 10*3/uL (ref 0.0–0.5)
Eosinophils Relative: 2 %
HCT: 46.5 % (ref 39.0–52.0)
Hemoglobin: 16.3 g/dL (ref 13.0–17.0)
Immature Granulocytes: 0 %
Lymphocytes Relative: 23 %
Lymphs Abs: 2.2 10*3/uL (ref 0.7–4.0)
MCH: 32.3 pg (ref 26.0–34.0)
MCHC: 35.1 g/dL (ref 30.0–36.0)
MCV: 92.1 fL (ref 80.0–100.0)
Monocytes Absolute: 0.8 10*3/uL (ref 0.1–1.0)
Monocytes Relative: 8 %
Neutro Abs: 6.3 10*3/uL (ref 1.7–7.7)
Neutrophils Relative %: 67 %
Platelets: 224 10*3/uL (ref 150–400)
RBC: 5.05 MIL/uL (ref 4.22–5.81)
RDW: 12.6 % (ref 11.5–15.5)
WBC: 9.6 10*3/uL (ref 4.0–10.5)
nRBC: 0 % (ref 0.0–0.2)

## 2021-10-11 LAB — D-DIMER, QUANTITATIVE: D-Dimer, Quant: 0.31 ug/mL-FEU (ref 0.00–0.50)

## 2021-10-11 MED ORDER — APIXABAN 2.5 MG PO TABS: 2.5000 mg | ORAL_TABLET | Freq: Two times a day (BID) | ORAL | 1 refills | Status: DC

## 2021-10-11 NOTE — Assessment & Plan Note (Addendum)
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 indefinite.  Refilled Eliquis.  # Right face/right UE- numbness [ since Thanksgiving]- ? Etiology-clinically not suggestive of stroke.   Trigeminal neuralgia versus others.  We will refer to Neurology  # DISPOSITION: # refer to [facility] Neurology re: facial numbness/ # follow up in  6 months-MD; labs- cbc/bmp/d-dimer- Dr.B

## 2021-10-11 NOTE — Progress Notes (Signed)
[place] NOTE  Patient Care Team: [name], MD as PCP - General (Family Medicine)  CHIEF COMPLAINTS/PURPOSE OF CONSULTATION: DVT   # RIGHT LE DV-[? 2017-2018] [ on xarelto]; ? Stroke [in [place]]x ? Duration; STOPPED xarelto;   # DEC 2nd 2021- LEFT LOWER EXTREMITY Acute deep venous thrombosis involving the mid to distal LEFT femoral vein, LEFT popliteal vein and extending into LEFT posterior tibial vein-on xarelto ; Family history: son had blood clot [group home]-factor V Leiden/prothrombin gene mutation negative; xarelto x6 months; then on eliquis 2.5 mg BID.   # PROSTATE CANCER [At [facility]- incidental; Dr.Davis]-    Oncology History   No history exists.    HISTORY OF PRESENTING ILLNESS:  Tyrone Fox 75 y.o.  male patient  uprovoked left lower extremity DVT/recurrent-currently on eliquis 2.5 BID follow-up.  Right facial numbness/ since TG. Improving. No deficits- noted.   Patient denies any worsening swelling of the legs.  Denies any blood clots.  Denies any blood in stools or black-colored stools or bleeding episodes.   Review of Systems  Constitutional:  Negative for chills, diaphoresis, fever, malaise/fatigue and weight loss.  HENT:  Negative for nosebleeds and sore throat.   Eyes:  Negative for double vision.  Respiratory:  Negative for cough, hemoptysis, sputum production, shortness of breath and wheezing.   Cardiovascular:  Negative for chest pain, palpitations, orthopnea and leg swelling.  Gastrointestinal:  Negative for abdominal pain, blood in stool, constipation, diarrhea, heartburn, melena, nausea and vomiting.  Genitourinary:  Negative for dysuria, frequency and urgency.  Musculoskeletal:  Negative for back pain and joint pain.  Skin: Negative.  Negative for itching and rash.  Neurological:  Negative for dizziness, tingling, focal weakness, weakness and headaches.  Endo/Heme/Allergies:  Does not bruise/bleed easily.   Psychiatric/Behavioral:  Negative for depression. The patient is not nervous/anxious and does not have insomnia.     MEDICAL HISTORY:  Past Medical History:  Diagnosis Date   Chronic kidney disease    also has kidney stone recently   History of prostate surgery 2020   Hypertension    Pre-diabetes    Stroke [facility])     SURGICAL HISTORY: Past Surgical History:  Procedure Laterality Date   CHOLECYSTECTOMY     COLONOSCOPY N/A 03/04/2017   Procedure: COLONOSCOPY;  Surgeon: Rogene Houston, MD;  [facility];  Service: Endoscopy;  Laterality: N/A;  [place] N/A 02/05/2017   Procedure: FLEXIBLE BRONCHOSCOPY;  Surgeon: [name], MD;  Location: [facility] ORS;  Service: Pulmonary;  Laterality: N/A;   TONSILLECTOMY      SOCIAL HISTORY: Social History   Socioeconomic History   Marital status: Married    Spouse name: Not on file   Number of children: Not on file   Years of education: Not on file   Highest education level: Not on file  Occupational History   Not on file  Tobacco Use   Smoking status: Never   Smokeless tobacco: Never  Vaping Use   Vaping Use: Never used  Substance and Sexual Activity   Alcohol use: No   Drug use: No   Sexual activity: Not Currently  Other Topics Concern   Not on file  Social History Narrative   Lives in [place]; with wife; never smoked; 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    Social Determinants of Health   Financial Resource Strain: Not on file  Food Insecurity: Not on file  Transportation Needs: Not on file  Physical Activity:  Not on file  Stress: Not on file  Social Connections: Not on file  Intimate Partner Violence: Not on file    FAMILY HISTORY: Family History  Problem Relation Age of Onset   Heart disease Mother    Hyperlipidemia Mother    Heart disease Father    Heart disease Maternal Grandmother    Heart attack Maternal Grandmother     ALLERGIES:  is allergic to ciprofloxacin and  penicillins.  MEDICATIONS:  Current Outpatient Medications  Medication Sig Dispense Refill   amLODipine (NORVASC) 2.5 MG tablet TAKE 1 TABLET(2.5 MG) BY MOUTH EVERY DAY     atorvastatin (LIPITOR) 40 MG tablet Take 40 mg by mouth daily.     glipiZIDE (GLUCOTROL XL) 2.5 MG 24 hr tablet Take by mouth.     montelukast (SINGULAIR) 10 MG tablet TAKE 1 TABLET(10 MG) BY MOUTH EVERY NIGHT     Multiple Vitamins-Minerals (CENTRUM SILVER) CHEW Chew 1 tablet by mouth daily.     omeprazole (PRILOSEC) 40 MG capsule Take 40 mg by mouth daily.     tamsulosin (FLOMAX) 0.4 MG CAPS capsule Take 0.4 mg by mouth 2 (two) times daily.     traZODone (DESYREL) 50 MG tablet Take by mouth.     apixaban (ELIQUIS) 2.5 MG TABS tablet Take 1 tablet (2.5 mg total) by mouth 2 (two) times daily. 180 tablet 1   fluticasone (FLONASE) 50 MCG/ACT nasal spray Place 2 sprays into both nostrils at bedtime. (Patient not taking: Reported on 04/12/2021) 16 g 2   loratadine (CLARITIN) 10 MG tablet Take 10 mg by mouth daily as needed for allergies. (Patient not taking: Reported on 04/12/2021)     ondansetron (ZOFRAN ODT) 4 MG disintegrating tablet Take 1 tablet (4 mg total) by mouth every 8 (eight) hours as needed for nausea or vomiting. (Patient not taking: Reported on 04/12/2021) 12 tablet 0   No current facility-administered medications for this visit.      Marland Kitchen  PHYSICAL EXAMINATION:  Vitals:   10/11/21 1006  BP: 112/78  Pulse: 84  Resp: 16  Temp: 97.7 F (36.5 C)   Filed Weights   10/11/21 1006  Weight: 213 lb (96.6 kg)    Physical Exam HENT:     Head: Normocephalic and atraumatic.     Mouth/Throat:     Pharynx: No oropharyngeal exudate.  Eyes:     Pupils: Pupils are equal, round, and reactive to light.  Cardiovascular:     Rate and Rhythm: Normal rate and regular rhythm.  Pulmonary:     Effort: Pulmonary effort is normal. No respiratory distress.     Breath sounds: Normal breath sounds. No wheezing.  Abdominal:      General: Bowel sounds are normal. There is no distension.     Palpations: Abdomen is soft. There is no mass.     Tenderness: There is no abdominal tenderness. There is no guarding or rebound.  Musculoskeletal:        General: No tenderness. Normal range of motion.     Cervical back: Normal range of motion and neck supple.  Skin:    General: Skin is warm.  Neurological:     Mental Status: He is alert and oriented to person, place, and time.  Psychiatric:        Mood and Affect: Affect normal.     LABORATORY DATA:  I have reviewed the data as listed Lab Results  Component Value Date   WBC 9.6 10/11/2021   HGB 16.3 10/11/2021  HCT 46.5 10/11/2021   MCV 92.1 10/11/2021   PLT 224 10/11/2021   Recent Labs    10/12/20 1225 01/11/21 1003 02/19/21 0827 02/24/21 1405 04/12/21 1007 10/11/21 0924  NA 139   < > 137 132* 137 135  K 4.6   < > 4.2 4.5 3.8 4.1  CL 103   < > 105 100 104 101  CO2 27   < > _0 GLUCOSE 176*   < > 259* 192* 271* 216*  BUN 14   < > _1 CREATININE 1.10   < > 1.46* 2.05* 1.12 1.20  CALCIUM 9.3   < > 9.2 8.8* 9.3 9.1  GFRNONAA >60   < > 50* 33* >60 >60  PROT 8.1  --  7.8  --   --   --   ALBUMIN 3.9  --  3.9  --   --   --   AST 29  --  25  --   --   --   ALT 27  --  29  --   --   --   ALKPHOS 53  --  49  --   --   --   BILITOT 1.8*  --  3.4*  --   --   --    < > = values in this interval not displayed.    RADIOGRAPHIC STUDIES: I have personally reviewed the radiological images as listed and agreed with the findings in the report. No results found.  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  Refilled Eliquis.  # Right face/right UE- numbness [ since Thanksgiving]- ? Etiology-clinically not suggestive of stroke.   Trigeminal neuralgia  versus others.  We will refer to Neurology  # DISPOSITION: # refer to [facility] Neurology re: facial numbness/ # follow up in  6 months-MD; labs- cbc/bmp/d-dimer- Dr.B  All questions were answered. The patient knows to call the clinic with any problems, questions or concerns.    Cammie Sickle, MD 10/11/2021 11:47 AM

## 2021-10-11 NOTE — Progress Notes (Signed)
Pt here and has no concerns with his DVT. He has noticed that he has on right side of face that he has numbness and tingling and noticed that his finger tips has same feeling on right hand

## 2021-11-06 ENCOUNTER — Other Ambulatory Visit: Payer: Self-pay | Admitting: Physician Assistant

## 2021-11-06 DIAGNOSIS — Z8673 Personal history of transient ischemic attack (TIA), and cerebral infarction without residual deficits: Secondary | ICD-10-CM

## 2021-11-06 DIAGNOSIS — R200 Anesthesia of skin: Secondary | ICD-10-CM

## 2021-11-17 ENCOUNTER — Ambulatory Visit
Admission: RE | Admit: 2021-11-17 | Discharge: 2021-11-17 | Disposition: A | Discharge status: Home / Self Care | Payer: Medicare Other | Source: Ambulatory Visit | Attending: Physician Assistant | Admitting: Physician Assistant

## 2021-11-17 ENCOUNTER — Other Ambulatory Visit: Payer: Self-pay

## 2021-11-17 DIAGNOSIS — R200 Anesthesia of skin: Secondary | ICD-10-CM | POA: Diagnosis present

## 2021-11-17 DIAGNOSIS — Z8673 Personal history of transient ischemic attack (TIA), and cerebral infarction without residual deficits: Secondary | ICD-10-CM | POA: Insufficient documentation

## 2021-11-17 DIAGNOSIS — R202 Paresthesia of skin: Secondary | ICD-10-CM | POA: Insufficient documentation

## 2021-11-17 MED ORDER — GADOBUTROL 1 MMOL/ML IV SOLN
10.0000 mL | Freq: Once | INTRAVENOUS | Status: AC | PRN
  Administered 2021-11-17: 10 mL via INTRAVENOUS

## 2021-11-25 ENCOUNTER — Telehealth: Payer: Self-pay | Admitting: *Deleted

## 2021-11-25 ENCOUNTER — Other Ambulatory Visit (HOSPITAL_COMMUNITY): Payer: Self-pay

## 2021-11-25 NOTE — Telephone Encounter (Signed)
Patient called reporting that he is in the donut hole right now and cannot afford the Eliquis. He is asking for something cheaper to replace it. Please advise

## 2021-12-26 ENCOUNTER — Ambulatory Visit: Admission: RE | Admit: 2021-12-26 | Payer: Medicare Other | Source: Home / Self Care | Admitting: *Deleted

## 2022-02-18 ENCOUNTER — Encounter (INDEPENDENT_AMBULATORY_CARE_PROVIDER_SITE_OTHER): Payer: Self-pay | Admitting: *Deleted

## 2022-04-11 ENCOUNTER — Encounter: Payer: Self-pay | Admitting: Internal Medicine

## 2022-04-11 ENCOUNTER — Inpatient Hospital Stay: Payer: Medicare Other | Attending: Internal Medicine

## 2022-04-11 ENCOUNTER — Other Ambulatory Visit: Payer: Self-pay

## 2022-04-11 ENCOUNTER — Inpatient Hospital Stay (HOSPITAL_BASED_OUTPATIENT_CLINIC_OR_DEPARTMENT_OTHER): Payer: Medicare Other | Admitting: Internal Medicine

## 2022-04-11 VITALS — BP 115/78 | HR 84 | Temp 98.0°F | Ht 67.0 in | Wt 207.2 lb

## 2022-04-11 DIAGNOSIS — I82402 Acute embolism and thrombosis of unspecified deep veins of left lower extremity: Secondary | ICD-10-CM | POA: Diagnosis not present

## 2022-04-11 DIAGNOSIS — Z7901 Long term (current) use of anticoagulants: Secondary | ICD-10-CM | POA: Insufficient documentation

## 2022-04-11 DIAGNOSIS — C61 Malignant neoplasm of prostate: Secondary | ICD-10-CM | POA: Insufficient documentation

## 2022-04-11 DIAGNOSIS — Z86718 Personal history of other venous thrombosis and embolism: Secondary | ICD-10-CM | POA: Diagnosis present

## 2022-04-11 LAB — BASIC METABOLIC PANEL
Anion gap: 8 (ref 5–15)
BUN: 13 mg/dL (ref 8–23)
CO2: 24 mmol/L (ref 22–32)
Calcium: 9.3 mg/dL (ref 8.9–10.3)
Chloride: 104 mmol/L (ref 98–111)
Creatinine, Ser: 1.14 mg/dL (ref 0.61–1.24)
GFR, Estimated: >60 mL/min (ref >60)
Glucose, Bld: 205 mg/dL — ABNORMAL HIGH (ref 70–99)
Potassium: 4.2 mmol/L (ref 3.5–5.1)
Sodium: 136 mmol/L (ref 135–145)

## 2022-04-11 LAB — CBC WITH DIFFERENTIAL/PLATELET
Abs Immature Granulocytes: 0.02 10*3/uL (ref 0.00–0.07)
Basophils Absolute: 0 10*3/uL (ref 0.0–0.1)
Basophils Relative: 0 %
Eosinophils Absolute: 0.1 10*3/uL (ref 0.0–0.5)
Eosinophils Relative: 1 %
HCT: 48.1 % (ref 39.0–52.0)
Hemoglobin: 16.7 g/dL (ref 13.0–17.0)
Immature Granulocytes: 0 %
Lymphocytes Relative: 16 %
Lymphs Abs: 1.4 10*3/uL (ref 0.7–4.0)
MCH: 32.6 pg (ref 26.0–34.0)
MCHC: 34.7 g/dL (ref 30.0–36.0)
MCV: 93.8 fL (ref 80.0–100.0)
Monocytes Absolute: 0.8 10*3/uL (ref 0.1–1.0)
Monocytes Relative: 8 %
Neutro Abs: 6.6 10*3/uL (ref 1.7–7.7)
Neutrophils Relative %: 75 %
Platelets: 229 10*3/uL (ref 150–400)
RBC: 5.13 MIL/uL (ref 4.22–5.81)
RDW: 11.9 % (ref 11.5–15.5)
WBC: 8.9 10*3/uL (ref 4.0–10.5)
nRBC: 0 % (ref 0.0–0.2)

## 2022-04-11 LAB — D-DIMER, QUANTITATIVE: D-Dimer, Quant: 0.39 ug/mL-FEU (ref 0.00–0.50)

## 2022-04-11 MED ORDER — APIXABAN 2.5 MG PO TABS: 2.5000 mg | ORAL_TABLET | Freq: Two times a day (BID) | ORAL | 1 refills | Status: DC

## 2022-04-11 NOTE — Assessment & Plan Note (Signed)
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 indefinite.  Refilled Eliquis.  # DM- HbA1c- 9.4- needs to better control..   # DISPOSITION: # follow up in FEB 2024-MD; labs- cbc/bmp/d-dimer- Dr.B

## 2022-04-11 NOTE — Progress Notes (Unsigned)
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 Stroke [in [place]]x ? Duration; STOPPED xarelto;   # DEC 2nd 2021- LEFT LOWER EXTREMITY Acute deep venous thrombosis involving the mid to distal LEFT femoral vein, LEFT popliteal vein and extending into LEFT posterior tibial vein-on xarelto ; Family history: son had blood clot [group home]-factor V Leiden/prothrombin gene mutation negative; xarelto x6 months; then on eliquis 2.5 mg BID.   # PROSTATE CANCER [At [facility]- incidental; Dr.Davis]-    Oncology History   No history exists.    HISTORY OF PRESENTING ILLNESS:  Tyrone Fox 76 y.o.  male patient  uprovoked left lower extremity DVT/recurrent-currently on eliquis 2.5 BID follow-up.  Right facial numbness/ since TG. Improving. No deficits- noted.   Patient denies any worsening swelling of the legs.  Denies any blood clots.  Denies any blood in stools or black-colored stools or bleeding episodes.   Review of Systems  Constitutional:  Negative for chills, diaphoresis, fever, malaise/fatigue and weight loss.  HENT:  Negative for nosebleeds and sore throat.   Eyes:  Negative for double vision.  Respiratory:  Negative for cough, hemoptysis, sputum production, shortness of breath and wheezing.   Cardiovascular:  Negative for chest pain, palpitations, orthopnea and leg swelling.  Gastrointestinal:  Negative for abdominal pain, blood in stool, constipation, diarrhea, heartburn, melena, nausea and vomiting.  Genitourinary:  Negative for dysuria, frequency and urgency.  Musculoskeletal:  Negative for back pain and joint pain.  Skin: Negative.  Negative for itching and rash.  Neurological:  Negative for dizziness, tingling, focal weakness, weakness and headaches.   Endo/Heme/Allergies:  Does not bruise/bleed easily.  Psychiatric/Behavioral:  Negative for depression. The patient is not nervous/anxious and does not have insomnia.     MEDICAL HISTORY:  Past Medical History:  Diagnosis Date  . Chronic kidney disease    also has kidney stone recently  . History of prostate surgery 2020  . Hypertension   . Pre-diabetes   . Stroke [facility])     SURGICAL HISTORY: Past Surgical History:  Procedure Laterality Date  . CHOLECYSTECTOMY    . COLONOSCOPY N/A 03/04/2017   Procedure: COLONOSCOPY;  Surgeon: Rogene Houston, MD;  [facility];  Service: Endoscopy;  Laterality: N/A;  830  . FLEXIBLE BRONCHOSCOPY N/A 02/05/2017   Procedure: FLEXIBLE BRONCHOSCOPY;  Surgeon: [name], MD;  Location: [facility] ORS;  Service: Pulmonary;  Laterality: N/A;  . TONSILLECTOMY      SOCIAL HISTORY: Social History   Socioeconomic History  . Marital status: Married    Spouse name: Not on file  . Number of children: Not on file  . Years of education: Not on file  . Highest education level: Not on file  Occupational History  . Not on file  Tobacco Use  . Smoking status: Never  . Smokeless tobacco: Never  Vaping Use  . Vaping Use: Never used  Substance and Sexual Activity  . Alcohol use: No  . Drug use: No  . Sexual activity: Not Currently  Other Topics Concern  . Not on file  Social History Narrative   Lives in [place]; with wife; never smoked; 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    Social Determinants of Health   Financial Resource Strain: Not on file  Food Insecurity: Not on file  Transportation Needs: Not on file  Physical Activity: Not on file  Stress: Not on file  Social Connections: Not on file  Intimate Partner Violence: Not on file    FAMILY HISTORY: Family History  Problem Relation Age of Onset  . Heart disease Mother   . Hyperlipidemia Mother   . Heart disease Father   . Heart disease Maternal Grandmother   . Heart attack  Maternal Grandmother     ALLERGIES:  is allergic to ceftriaxone, ciprofloxacin, and penicillins.  MEDICATIONS:  Current Outpatient Medications  Medication Sig Dispense Refill  . apixaban (ELIQUIS) 2.5 MG TABS tablet Take 1 tablet (2.5 mg total) by mouth 2 (two) times daily. 180 tablet 1  . atorvastatin (LIPITOR) 40 MG tablet Take 40 mg by mouth daily.    . Multiple Vitamins-Minerals (CENTRUM SILVER) CHEW Chew 1 tablet by mouth daily.    Marland Kitchen omeprazole (PRILOSEC) 40 MG capsule Take 40 mg by mouth daily.    . tamsulosin (FLOMAX) 0.4 MG CAPS capsule Take 0.4 mg by mouth 2 (two) times daily.    . traZODone (DESYREL) 50 MG tablet Take by mouth.    Marland Kitchen amLODipine (NORVASC) 2.5 MG tablet TAKE 1 TABLET(2.5 MG) BY MOUTH EVERY DAY (Patient not taking: Reported on 04/11/2022)    . buPROPion ER (WELLBUTRIN SR) 100 MG 12 hr tablet Take 1 tablet by mouth 2 (two) times daily. (Patient not taking: Reported on 04/11/2022)    . fluticasone (FLONASE) 50 MCG/ACT nasal spray Place 2 sprays into both nostrils at bedtime. (Patient not taking: Reported on 04/12/2021) 16 g 2  . glipiZIDE (GLUCOTROL XL) 2.5 MG 24 hr tablet Take by mouth.    . loratadine (CLARITIN) 10 MG tablet Take 10 mg by mouth daily as needed for allergies. (Patient not taking: Reported on 04/12/2021)    . montelukast (SINGULAIR) 10 MG tablet TAKE 1 TABLET(10 MG) BY MOUTH EVERY NIGHT (Patient not taking: Reported on 04/11/2022)    . ondansetron (ZOFRAN ODT) 4 MG disintegrating tablet Take 1 tablet (4 mg total) by mouth every 8 (eight) hours as needed for nausea or vomiting. (Patient not taking: Reported on 04/12/2021) 12 tablet 0   No current facility-administered medications for this visit.      Marland Kitchen  PHYSICAL EXAMINATION:  Vitals:   04/11/22 1015  BP: 115/78  Pulse: 84  Temp: 98 F (36.7 C)  SpO2: 96%   Filed Weights   04/11/22 1015  Weight: 207 lb 3.2 oz (94 kg)    Physical Exam HENT:     Head: Normocephalic and atraumatic.      Mouth/Throat:     Pharynx: No oropharyngeal exudate.  Eyes:     Pupils: Pupils are equal, round, and reactive to light.  Cardiovascular:     Rate and Rhythm: Normal rate and regular rhythm.  Pulmonary:     Effort: Pulmonary effort is normal. No respiratory distress.     Breath sounds: Normal breath sounds. No wheezing.  Abdominal:     General: Bowel sounds are normal. There is no distension.     Palpations: Abdomen is soft. There is no mass.     Tenderness: There is no abdominal tenderness. There is no guarding or rebound.  Musculoskeletal:        General: No tenderness. Normal range of motion.     Cervical back: Normal range of motion and neck supple.  Skin:    General: Skin is warm.  Neurological:     Mental Status: He is alert  and oriented to person, place, and time.  Psychiatric:        Mood and Affect: Affect normal.     LABORATORY DATA:  I have reviewed the data as listed Lab Results  Component Value Date   WBC 8.9 04/11/2022   HGB 16.7 04/11/2022   HCT 48.1 04/11/2022   MCV 93.8 04/11/2022   PLT 229 04/11/2022   Recent Labs    04/12/21 1007 10/11/21 0924 04/11/22 1009  NA 137 135 136  K 3.8 4.1 4.2  CL 104 101 104  CO2 _0 GLUCOSE 271* 216* 205*  BUN _1 CREATININE 1.12 1.20 1.14  CALCIUM 9.3 9.1 9.3  GFRNONAA >60 >60 >60     RADIOGRAPHIC STUDIES: I have personally reviewed the radiological images as listed and agreed with the findings in the report. No results found.  ASSESSMENT & PLAN:   No problem-specific Assessment & Plan notes found for this encounter.  All questions were answered. The patient knows to call the clinic with any problems, questions or concerns.    Cammie Sickle, MD 04/11/2022 11:08 AM

## 2022-08-13 ENCOUNTER — Ambulatory Visit: Payer: Medicare Other

## 2022-10-21 ENCOUNTER — Other Ambulatory Visit: Payer: Self-pay | Admitting: Internal Medicine

## 2022-11-05 DIAGNOSIS — E119 Type 2 diabetes mellitus without complications: Secondary | ICD-10-CM | POA: Diagnosis not present

## 2022-11-12 DIAGNOSIS — E785 Hyperlipidemia, unspecified: Secondary | ICD-10-CM | POA: Diagnosis not present

## 2022-11-12 DIAGNOSIS — E119 Type 2 diabetes mellitus without complications: Secondary | ICD-10-CM | POA: Diagnosis not present

## 2022-11-12 DIAGNOSIS — C61 Malignant neoplasm of prostate: Secondary | ICD-10-CM | POA: Diagnosis not present

## 2022-11-12 DIAGNOSIS — E1165 Type 2 diabetes mellitus with hyperglycemia: Secondary | ICD-10-CM | POA: Diagnosis not present

## 2022-11-12 DIAGNOSIS — K5900 Constipation, unspecified: Secondary | ICD-10-CM | POA: Diagnosis not present

## 2022-11-12 DIAGNOSIS — I10 Essential (primary) hypertension: Secondary | ICD-10-CM | POA: Diagnosis not present

## 2022-11-12 DIAGNOSIS — N1831 Chronic kidney disease, stage 3a: Secondary | ICD-10-CM | POA: Diagnosis not present

## 2022-11-12 DIAGNOSIS — I2782 Chronic pulmonary embolism: Secondary | ICD-10-CM | POA: Diagnosis not present

## 2022-12-04 DIAGNOSIS — C61 Malignant neoplasm of prostate: Secondary | ICD-10-CM | POA: Diagnosis not present

## 2022-12-04 DIAGNOSIS — N529 Male erectile dysfunction, unspecified: Secondary | ICD-10-CM | POA: Diagnosis not present

## 2022-12-04 DIAGNOSIS — N200 Calculus of kidney: Secondary | ICD-10-CM | POA: Diagnosis not present

## 2022-12-17 ENCOUNTER — Inpatient Hospital Stay: Payer: Self-pay

## 2022-12-17 ENCOUNTER — Inpatient Hospital Stay: Payer: HMO

## 2022-12-17 ENCOUNTER — Inpatient Hospital Stay: Payer: Self-pay | Admitting: Internal Medicine

## 2022-12-17 ENCOUNTER — Inpatient Hospital Stay: Payer: HMO | Attending: Internal Medicine | Admitting: Internal Medicine

## 2022-12-17 VITALS — BP 110/78 | HR 76 | Temp 97.0°F | Resp 18 | Wt 202.0 lb

## 2022-12-17 DIAGNOSIS — I82402 Acute embolism and thrombosis of unspecified deep veins of left lower extremity: Secondary | ICD-10-CM

## 2022-12-17 DIAGNOSIS — I129 Hypertensive chronic kidney disease with stage 1 through stage 4 chronic kidney disease, or unspecified chronic kidney disease: Secondary | ICD-10-CM | POA: Diagnosis not present

## 2022-12-17 DIAGNOSIS — Z86718 Personal history of other venous thrombosis and embolism: Secondary | ICD-10-CM | POA: Insufficient documentation

## 2022-12-17 DIAGNOSIS — E1122 Type 2 diabetes mellitus with diabetic chronic kidney disease: Secondary | ICD-10-CM | POA: Insufficient documentation

## 2022-12-17 DIAGNOSIS — C61 Malignant neoplasm of prostate: Secondary | ICD-10-CM | POA: Diagnosis not present

## 2022-12-17 DIAGNOSIS — Z7984 Long term (current) use of oral hypoglycemic drugs: Secondary | ICD-10-CM | POA: Diagnosis not present

## 2022-12-17 DIAGNOSIS — N189 Chronic kidney disease, unspecified: Secondary | ICD-10-CM | POA: Diagnosis not present

## 2022-12-17 DIAGNOSIS — Z79899 Other long term (current) drug therapy: Secondary | ICD-10-CM | POA: Insufficient documentation

## 2022-12-17 DIAGNOSIS — Z8673 Personal history of transient ischemic attack (TIA), and cerebral infarction without residual deficits: Secondary | ICD-10-CM | POA: Diagnosis not present

## 2022-12-17 DIAGNOSIS — Z7901 Long term (current) use of anticoagulants: Secondary | ICD-10-CM | POA: Diagnosis not present

## 2022-12-17 LAB — BASIC METABOLIC PANEL
Anion gap: 8 (ref 5–15)
BUN: 12 mg/dL (ref 8–23)
CO2: 24 mmol/L (ref 22–32)
Calcium: 8.9 mg/dL (ref 8.9–10.3)
Chloride: 102 mmol/L (ref 98–111)
Creatinine, Ser: 1.07 mg/dL (ref 0.61–1.24)
GFR, Estimated: >60 mL/min (ref >60)
Glucose, Bld: 231 mg/dL — ABNORMAL HIGH (ref 70–99)
Potassium: 3.9 mmol/L (ref 3.5–5.1)
Sodium: 134 mmol/L — ABNORMAL LOW (ref 135–145)

## 2022-12-17 LAB — CBC WITH DIFFERENTIAL/PLATELET
Abs Immature Granulocytes: 0.02 10*3/uL (ref 0.00–0.07)
Basophils Absolute: 0 10*3/uL (ref 0.0–0.1)
Basophils Relative: 1 %
Eosinophils Absolute: 0.1 10*3/uL (ref 0.0–0.5)
Eosinophils Relative: 1 %
HCT: 47.5 % (ref 39.0–52.0)
Hemoglobin: 16.1 g/dL (ref 13.0–17.0)
Immature Granulocytes: 0 %
Lymphocytes Relative: 17 %
Lymphs Abs: 1.5 10*3/uL (ref 0.7–4.0)
MCH: 31.8 pg (ref 26.0–34.0)
MCHC: 33.9 g/dL (ref 30.0–36.0)
MCV: 93.7 fL (ref 80.0–100.0)
Monocytes Absolute: 0.8 10*3/uL (ref 0.1–1.0)
Monocytes Relative: 9 %
Neutro Abs: 6.4 10*3/uL (ref 1.7–7.7)
Neutrophils Relative %: 72 %
Platelets: 233 10*3/uL (ref 150–400)
RBC: 5.07 MIL/uL (ref 4.22–5.81)
RDW: 12.4 % (ref 11.5–15.5)
WBC: 8.8 10*3/uL (ref 4.0–10.5)
nRBC: 0 % (ref 0.0–0.2)

## 2022-12-17 LAB — D-DIMER, QUANTITATIVE: D-Dimer, Quant: 0.45 ug/mL-FEU (ref 0.00–0.50)

## 2022-12-17 NOTE — Assessment & Plan Note (Addendum)
#   Unprovoked/recurrent acute DVT of the left lower extremity [Dec 2nd, 2021]-Given recurrent/unprovoked DVT-continue Eliquis 2.5 mg twice daily- recommend indefinite.   # DM- HbA1c- 9.4- needs to better control. PBF BG- 231-  counseled the patient regarding low-carb diet; also recommend checking his blood sugars at least 3 times a day-follow-up with PCP.  # Since patient is clinically stable I think is reasonable for the patient to follow-up with PCP/can follow-up with [place] as needed. Pt will need to be on eliquis indefinitely.  Patient comfortable with the plan; to call us if any questions or concerns in the interim. Recommend follow up with PCP re: further refills.   # DISPOSITION: # follow up as needed-  Dr.B  Cc; Dr.Hedrick.

## 2022-12-17 NOTE — Progress Notes (Signed)
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 Stroke [in [place]] x ? Duration; STOPPED xarelto;   # DEC 2nd 2021- LEFT LOWER EXTREMITY Acute deep venous thrombosis involving the mid to distal LEFT femoral vein, LEFT popliteal vein and extending into LEFT posterior tibial vein-on xarelto ; Family history: son had blood clot [group home]-factor V Leiden/prothrombin gene mutation negative; xarelto x6 months; then on eliquis 2.5 mg BID.   # PROSTATE CANCER [At [facility]- incidental; Dr.Davis]-    Oncology History   No history exists.    HISTORY OF PRESENTING ILLNESS: Alone.  Walks with a cane.  Tyrone Fox 77 y.o.  male patient  uprovoked left lower extremity DVT/recurrent-currently on eliquis 2.5 BID follow-up.  Complains of weight gain.  Diabetes not well controlled as per patient. No falls. Denies any blood in stools or black-colored stools or bleeding episodes. Denies any blood clots.   Patient denies any worsening swelling of the legs.     Review of Systems  Constitutional:  Negative for chills, diaphoresis, fever, malaise/fatigue and weight loss.  HENT:  Negative for nosebleeds and sore throat.   Eyes:  Negative for double vision.  Respiratory:  Negative for cough, hemoptysis, sputum production, shortness of breath and wheezing.   Cardiovascular:  Negative for chest pain, palpitations, orthopnea and leg swelling.  Gastrointestinal:  Negative for abdominal pain, blood in stool, constipation, diarrhea, heartburn, melena, nausea and vomiting.  Genitourinary:  Negative for dysuria, frequency and urgency.  Musculoskeletal:  Positive for back pain and joint pain.  Skin: Negative.  Negative for itching and rash.  Neurological:  Negative for dizziness, tingling,  focal weakness, weakness and headaches.  Endo/Heme/Allergies:  Does not bruise/bleed easily.  Psychiatric/Behavioral:  Negative for depression. The patient is not nervous/anxious and does not have insomnia.      MEDICAL HISTORY:  Past Medical History:  Diagnosis Date   Chronic kidney disease    also has kidney stone recently   History of prostate surgery 2020   Hypertension    Pre-diabetes    Stroke [facility])     SURGICAL HISTORY: Past Surgical History:  Procedure Laterality Date   CHOLECYSTECTOMY     COLONOSCOPY N/A 03/04/2017   Procedure: COLONOSCOPY;  Surgeon: Tyrone Houston, MD;  [facility];  Service: Endoscopy;  Laterality: N/A;  [place] N/A 02/05/2017   Procedure: FLEXIBLE BRONCHOSCOPY;  Surgeon: Tyrone Hobby, MD;  Location: [facility] ORS;  Service: Pulmonary;  Laterality: N/A;   TONSILLECTOMY      SOCIAL HISTORY: Social History   Socioeconomic History   Marital status: Married    Spouse name: Not on file   Number of children: Not on file   Years of education: Not on file   Highest education level: Not on file  Occupational History   Not on file  Tobacco Use   Smoking status: Never   Smokeless tobacco: Never  Vaping Use   Vaping Use: Never used  Substance and Sexual Activity   Alcohol use: No   Drug use: No   Sexual activity: Not Currently  Other Topics Concern   Not on file  Social History Narrative   Lives in [place]; with wife; never smoked; 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    Social Determinants of Health  Financial Resource Strain: Not on file  Food Insecurity: Not on file  Transportation Needs: Not on file  Physical Activity: Not on file  Stress: Not on file  Social Connections: Not on file  Intimate Partner Violence: Not on file    FAMILY HISTORY: Family History  Problem Relation Age of Onset   Heart disease Mother    Hyperlipidemia Mother    Heart disease Father    Heart disease Maternal Grandmother     Heart attack Maternal Grandmother     ALLERGIES:  is allergic to ceftriaxone, ciprofloxacin, and penicillins.  MEDICATIONS:  Current Outpatient Medications  Medication Sig Dispense Refill   amLODipine (NORVASC) 2.5 MG tablet      atorvastatin (LIPITOR) 40 MG tablet Take 40 mg by mouth daily.     buPROPion ER (WELLBUTRIN SR) 100 MG 12 hr tablet Take 1 tablet by mouth 2 (two) times daily.     ELIQUIS 2.5 MG TABS tablet TAKE 1 TABLET(2.5 MG) BY MOUTH TWICE DAILY 180 tablet 1   glipiZIDE (GLUCOTROL XL) 2.5 MG 24 hr tablet Take by mouth.     montelukast (SINGULAIR) 10 MG tablet      Multiple Vitamins-Minerals (CENTRUM SILVER) CHEW Chew 1 tablet by mouth daily.     omeprazole (PRILOSEC) 40 MG capsule Take 40 mg by mouth daily.     tadalafil (CIALIS) 5 MG tablet Take 5 mg by mouth daily as needed for erectile dysfunction.     tamsulosin (FLOMAX) 0.4 MG CAPS capsule Take 0.4 mg by mouth 2 (two) times daily.     traZODone (DESYREL) 50 MG tablet Take by mouth.     fluticasone (FLONASE) 50 MCG/ACT nasal spray Place 2 sprays into both nostrils at bedtime. (Patient not taking: Reported on 04/12/2021) 16 g 2   loratadine (CLARITIN) 10 MG tablet Take 10 mg by mouth daily as needed for allergies. (Patient not taking: Reported on 04/12/2021)     ondansetron (ZOFRAN ODT) 4 MG disintegrating tablet Take 1 tablet (4 mg total) by mouth every 8 (eight) hours as needed for nausea or vomiting. (Patient not taking: Reported on 04/12/2021) 12 tablet 0   No current facility-administered medications for this visit.      Marland Kitchen  PHYSICAL EXAMINATION:  Vitals:   12/17/22 1045  BP: 110/78  Pulse: 76  Resp: 18  Temp: (!) 97 F (36.1 C)  SpO2: 95%    Filed Weights   12/17/22 1045  Weight: 202 lb (91.6 kg)     Physical Exam HENT:     Head: Normocephalic and atraumatic.     Mouth/Throat:     Pharynx: No oropharyngeal exudate.  Eyes:     Pupils: Pupils are equal, round, and reactive to light.   Cardiovascular:     Rate and Rhythm: Normal rate and regular rhythm.  Pulmonary:     Effort: Pulmonary effort is normal. No respiratory distress.     Breath sounds: Normal breath sounds. No wheezing.  Abdominal:     General: Bowel sounds are normal. There is no distension.     Palpations: Abdomen is soft. There is no mass.     Tenderness: There is no abdominal tenderness. There is no guarding or rebound.  Musculoskeletal:        General: No tenderness. Normal range of motion.     Cervical back: Normal range of motion and neck supple.  Skin:    General: Skin is warm.  Neurological:     Mental Status: He is alert  and oriented to person, place, and time.  Psychiatric:        Mood and Affect: Affect normal.      LABORATORY DATA:  I have reviewed the data as listed Lab Results  Component Value Date   WBC 8.8 12/17/2022   HGB 16.1 12/17/2022   HCT 47.5 12/17/2022   MCV 93.7 12/17/2022   PLT 233 12/17/2022   Recent Labs    04/11/22 1009 12/17/22 1022  NA 136 134*  K 4.2 3.9  CL 104 102  CO2 24 24  GLUCOSE 205* 231*  BUN 13 12  CREATININE 1.14 1.07  CALCIUM 9.3 8.9  GFRNONAA >60 >60    RADIOGRAPHIC STUDIES: I have personally reviewed the radiological images as listed and agreed with the findings in the report. No results found.  ASSESSMENT & PLAN:   Leg DVT (deep venous thromboembolism), acute, left (HCC) # Unprovoked/recurrent acute DVT of the left lower extremity [Dec 2nd, 2021]-Given recurrent/unprovoked DVT-continue Eliquis 2.5 mg twice daily- recommend indefinite.   # DM- HbA1c- 9.4- needs to better control. PBF BG- 231-  counseled the patient regarding low-carb diet; also recommend checking his blood sugars at least 3 times a day-follow-up with PCP.  # Since patient is clinically stable I think is reasonable for the patient to follow-up with PCP/can follow-up with [place] as needed. Pt will need to be on eliquis indefinitely.  Patient comfortable with the plan; to  call us if any questions or concerns in the interim. Recommend follow up with PCP re: further refills.   # DISPOSITION: # follow up as needed-  Dr.B  Cc; Dr.Hedrick.    All questions were answered. The patient knows to call the clinic with any problems, questions or concerns.    Tyrone Sickle, MD 12/17/2022 11:01 AM

## 2022-12-17 NOTE — Progress Notes (Signed)
No complaints of leg pain or swelling. No other concerns today. Appetite is good. No chest pain. Denies any difficulty breathing.

## 2022-12-19 DIAGNOSIS — F3342 Major depressive disorder, recurrent, in full remission: Secondary | ICD-10-CM | POA: Diagnosis not present

## 2022-12-19 DIAGNOSIS — G8929 Other chronic pain: Secondary | ICD-10-CM | POA: Diagnosis not present

## 2022-12-19 DIAGNOSIS — H9193 Unspecified hearing loss, bilateral: Secondary | ICD-10-CM | POA: Diagnosis not present

## 2022-12-19 DIAGNOSIS — I4891 Unspecified atrial fibrillation: Secondary | ICD-10-CM | POA: Diagnosis not present

## 2022-12-19 DIAGNOSIS — E1169 Type 2 diabetes mellitus with other specified complication: Secondary | ICD-10-CM | POA: Diagnosis not present

## 2022-12-19 DIAGNOSIS — G4733 Obstructive sleep apnea (adult) (pediatric): Secondary | ICD-10-CM | POA: Diagnosis not present

## 2022-12-19 DIAGNOSIS — E785 Hyperlipidemia, unspecified: Secondary | ICD-10-CM | POA: Diagnosis not present

## 2022-12-19 DIAGNOSIS — I69354 Hemiplegia and hemiparesis following cerebral infarction affecting left non-dominant side: Secondary | ICD-10-CM | POA: Diagnosis not present

## 2022-12-19 DIAGNOSIS — G4700 Insomnia, unspecified: Secondary | ICD-10-CM | POA: Diagnosis not present

## 2022-12-19 DIAGNOSIS — D6869 Other thrombophilia: Secondary | ICD-10-CM | POA: Diagnosis not present

## 2022-12-19 DIAGNOSIS — E1165 Type 2 diabetes mellitus with hyperglycemia: Secondary | ICD-10-CM | POA: Diagnosis not present

## 2022-12-19 DIAGNOSIS — I10 Essential (primary) hypertension: Secondary | ICD-10-CM | POA: Diagnosis not present

## 2022-12-22 DIAGNOSIS — G4700 Insomnia, unspecified: Secondary | ICD-10-CM | POA: Diagnosis not present

## 2022-12-22 DIAGNOSIS — I10 Essential (primary) hypertension: Secondary | ICD-10-CM | POA: Diagnosis not present

## 2022-12-22 DIAGNOSIS — G4733 Obstructive sleep apnea (adult) (pediatric): Secondary | ICD-10-CM | POA: Diagnosis not present

## 2023-01-22 DIAGNOSIS — D225 Melanocytic nevi of trunk: Secondary | ICD-10-CM | POA: Diagnosis not present

## 2023-01-22 DIAGNOSIS — B078 Other viral warts: Secondary | ICD-10-CM | POA: Diagnosis not present

## 2023-01-22 DIAGNOSIS — X32XXXD Exposure to sunlight, subsequent encounter: Secondary | ICD-10-CM | POA: Diagnosis not present

## 2023-01-22 DIAGNOSIS — L570 Actinic keratosis: Secondary | ICD-10-CM | POA: Diagnosis not present

## 2023-02-06 DIAGNOSIS — G4733 Obstructive sleep apnea (adult) (pediatric): Secondary | ICD-10-CM | POA: Diagnosis not present

## 2023-03-25 DIAGNOSIS — G4733 Obstructive sleep apnea (adult) (pediatric): Secondary | ICD-10-CM | POA: Diagnosis not present

## 2023-03-26 DIAGNOSIS — G4700 Insomnia, unspecified: Secondary | ICD-10-CM | POA: Diagnosis not present

## 2023-03-26 DIAGNOSIS — I10 Essential (primary) hypertension: Secondary | ICD-10-CM | POA: Diagnosis not present

## 2023-03-26 DIAGNOSIS — G4733 Obstructive sleep apnea (adult) (pediatric): Secondary | ICD-10-CM | POA: Diagnosis not present

## 2023-04-06 DIAGNOSIS — Z7901 Long term (current) use of anticoagulants: Secondary | ICD-10-CM | POA: Diagnosis not present

## 2023-04-06 DIAGNOSIS — E119 Type 2 diabetes mellitus without complications: Secondary | ICD-10-CM | POA: Diagnosis not present

## 2023-04-06 DIAGNOSIS — I10 Essential (primary) hypertension: Secondary | ICD-10-CM | POA: Diagnosis not present

## 2023-04-06 DIAGNOSIS — E785 Hyperlipidemia, unspecified: Secondary | ICD-10-CM | POA: Diagnosis not present

## 2023-04-13 DIAGNOSIS — E119 Type 2 diabetes mellitus without complications: Secondary | ICD-10-CM | POA: Diagnosis not present

## 2023-04-13 DIAGNOSIS — Z0000 Encounter for general adult medical examination without abnormal findings: Secondary | ICD-10-CM | POA: Diagnosis not present

## 2023-04-13 DIAGNOSIS — I10 Essential (primary) hypertension: Secondary | ICD-10-CM | POA: Diagnosis not present

## 2023-04-13 DIAGNOSIS — G4700 Insomnia, unspecified: Secondary | ICD-10-CM | POA: Diagnosis not present

## 2023-04-13 DIAGNOSIS — H5789 Other specified disorders of eye and adnexa: Secondary | ICD-10-CM | POA: Diagnosis not present

## 2023-04-13 DIAGNOSIS — E785 Hyperlipidemia, unspecified: Secondary | ICD-10-CM | POA: Diagnosis not present

## 2023-04-13 DIAGNOSIS — C61 Malignant neoplasm of prostate: Secondary | ICD-10-CM | POA: Diagnosis not present

## 2023-04-13 DIAGNOSIS — R413 Other amnesia: Secondary | ICD-10-CM | POA: Diagnosis not present

## 2023-04-25 DIAGNOSIS — G4733 Obstructive sleep apnea (adult) (pediatric): Secondary | ICD-10-CM | POA: Diagnosis not present

## 2023-05-21 DIAGNOSIS — L820 Inflamed seborrheic keratosis: Secondary | ICD-10-CM | POA: Diagnosis not present

## 2023-05-21 DIAGNOSIS — X32XXXD Exposure to sunlight, subsequent encounter: Secondary | ICD-10-CM | POA: Diagnosis not present

## 2023-05-21 DIAGNOSIS — L218 Other seborrheic dermatitis: Secondary | ICD-10-CM | POA: Diagnosis not present

## 2023-05-21 DIAGNOSIS — L570 Actinic keratosis: Secondary | ICD-10-CM | POA: Diagnosis not present

## 2023-05-25 DIAGNOSIS — G4733 Obstructive sleep apnea (adult) (pediatric): Secondary | ICD-10-CM | POA: Diagnosis not present

## 2023-06-22 DIAGNOSIS — N281 Cyst of kidney, acquired: Secondary | ICD-10-CM | POA: Diagnosis not present

## 2023-06-22 DIAGNOSIS — N200 Calculus of kidney: Secondary | ICD-10-CM | POA: Diagnosis not present

## 2023-06-22 DIAGNOSIS — C61 Malignant neoplasm of prostate: Secondary | ICD-10-CM | POA: Diagnosis not present

## 2023-06-25 DIAGNOSIS — G4733 Obstructive sleep apnea (adult) (pediatric): Secondary | ICD-10-CM | POA: Diagnosis not present

## 2023-07-26 DIAGNOSIS — G4733 Obstructive sleep apnea (adult) (pediatric): Secondary | ICD-10-CM | POA: Diagnosis not present

## 2023-08-06 DIAGNOSIS — E119 Type 2 diabetes mellitus without complications: Secondary | ICD-10-CM | POA: Diagnosis not present

## 2023-08-07 DIAGNOSIS — I10 Essential (primary) hypertension: Secondary | ICD-10-CM | POA: Diagnosis not present

## 2023-08-07 DIAGNOSIS — G4733 Obstructive sleep apnea (adult) (pediatric): Secondary | ICD-10-CM | POA: Diagnosis not present

## 2023-08-07 DIAGNOSIS — G4700 Insomnia, unspecified: Secondary | ICD-10-CM | POA: Diagnosis not present

## 2023-08-11 ENCOUNTER — Ambulatory Visit: Payer: HMO

## 2023-08-11 DIAGNOSIS — Z23 Encounter for immunization: Secondary | ICD-10-CM | POA: Diagnosis not present

## 2023-08-11 DIAGNOSIS — Z719 Counseling, unspecified: Secondary | ICD-10-CM

## 2023-08-11 NOTE — Progress Notes (Signed)
Patient seen in nurse clinic for COVID and flu vaccines. Cominraty +12Y [id number] and Flu HD given. VIS provided.  Tolerated well. [facility] updated and copy provided. Waited 15 minutes.

## 2023-08-13 DIAGNOSIS — G4733 Obstructive sleep apnea (adult) (pediatric): Secondary | ICD-10-CM | POA: Diagnosis not present

## 2023-08-13 DIAGNOSIS — E1159 Type 2 diabetes mellitus with other circulatory complications: Secondary | ICD-10-CM | POA: Diagnosis not present

## 2023-08-13 DIAGNOSIS — I10 Essential (primary) hypertension: Secondary | ICD-10-CM | POA: Diagnosis not present

## 2023-08-13 DIAGNOSIS — H65115 Acute and subacute allergic otitis media (mucoid) (sanguinous) (serous), recurrent, left ear: Secondary | ICD-10-CM | POA: Diagnosis not present

## 2023-08-13 DIAGNOSIS — G4700 Insomnia, unspecified: Secondary | ICD-10-CM | POA: Diagnosis not present

## 2023-08-13 DIAGNOSIS — E785 Hyperlipidemia, unspecified: Secondary | ICD-10-CM | POA: Diagnosis not present

## 2023-08-13 DIAGNOSIS — Z6835 Body mass index (BMI) 35.0-35.9, adult: Secondary | ICD-10-CM | POA: Diagnosis not present

## 2023-08-25 DIAGNOSIS — G4733 Obstructive sleep apnea (adult) (pediatric): Secondary | ICD-10-CM | POA: Diagnosis not present

## 2023-09-25 DIAGNOSIS — G4733 Obstructive sleep apnea (adult) (pediatric): Secondary | ICD-10-CM | POA: Diagnosis not present

## 2023-09-29 DIAGNOSIS — H5213 Myopia, bilateral: Secondary | ICD-10-CM | POA: Diagnosis not present

## 2023-09-29 DIAGNOSIS — H52223 Regular astigmatism, bilateral: Secondary | ICD-10-CM | POA: Diagnosis not present

## 2023-09-29 DIAGNOSIS — H10233 Serous conjunctivitis, except viral, bilateral: Secondary | ICD-10-CM | POA: Diagnosis not present

## 2023-09-29 DIAGNOSIS — Z7984 Long term (current) use of oral hypoglycemic drugs: Secondary | ICD-10-CM | POA: Diagnosis not present

## 2023-09-29 DIAGNOSIS — E119 Type 2 diabetes mellitus without complications: Secondary | ICD-10-CM | POA: Diagnosis not present

## 2023-09-29 DIAGNOSIS — Z961 Presence of intraocular lens: Secondary | ICD-10-CM | POA: Diagnosis not present

## 2023-09-29 DIAGNOSIS — H524 Presbyopia: Secondary | ICD-10-CM | POA: Diagnosis not present

## 2023-10-19 DIAGNOSIS — R058 Other specified cough: Secondary | ICD-10-CM | POA: Diagnosis not present

## 2023-11-18 DIAGNOSIS — H8112 Benign paroxysmal vertigo, left ear: Secondary | ICD-10-CM | POA: Diagnosis not present

## 2023-11-30 ENCOUNTER — Other Ambulatory Visit: Payer: Self-pay | Admitting: Student

## 2023-11-30 DIAGNOSIS — R42 Dizziness and giddiness: Secondary | ICD-10-CM

## 2023-11-30 DIAGNOSIS — R2681 Unsteadiness on feet: Secondary | ICD-10-CM | POA: Diagnosis not present

## 2023-12-14 DIAGNOSIS — I10 Essential (primary) hypertension: Secondary | ICD-10-CM | POA: Diagnosis not present

## 2023-12-14 DIAGNOSIS — I6930 Unspecified sequelae of cerebral infarction: Secondary | ICD-10-CM | POA: Diagnosis not present

## 2023-12-14 DIAGNOSIS — E785 Hyperlipidemia, unspecified: Secondary | ICD-10-CM | POA: Diagnosis not present

## 2023-12-14 DIAGNOSIS — C61 Malignant neoplasm of prostate: Secondary | ICD-10-CM | POA: Diagnosis not present

## 2023-12-14 DIAGNOSIS — E119 Type 2 diabetes mellitus without complications: Secondary | ICD-10-CM | POA: Diagnosis not present

## 2023-12-14 DIAGNOSIS — N1831 Chronic kidney disease, stage 3a: Secondary | ICD-10-CM | POA: Diagnosis not present

## 2023-12-14 DIAGNOSIS — E1159 Type 2 diabetes mellitus with other circulatory complications: Secondary | ICD-10-CM | POA: Diagnosis not present

## 2023-12-15 ENCOUNTER — Ambulatory Visit
Admission: RE | Admit: 2023-12-15 | Discharge: 2023-12-15 | Disposition: A | Discharge status: Home / Self Care | Payer: HMO | Source: Ambulatory Visit | Attending: Student

## 2023-12-15 DIAGNOSIS — R42 Dizziness and giddiness: Secondary | ICD-10-CM | POA: Diagnosis not present

## 2023-12-15 MED ORDER — GADOPICLENOL 0.5 MMOL/ML IV SOLN
10.0000 mL | Freq: Once | INTRAVENOUS | Status: AC | PRN
  Administered 2023-12-15: 8 mL via INTRAVENOUS

## 2024-01-21 DIAGNOSIS — N529 Male erectile dysfunction, unspecified: Secondary | ICD-10-CM | POA: Diagnosis not present

## 2024-01-21 DIAGNOSIS — C61 Malignant neoplasm of prostate: Secondary | ICD-10-CM | POA: Diagnosis not present

## 2024-01-21 DIAGNOSIS — N200 Calculus of kidney: Secondary | ICD-10-CM | POA: Diagnosis not present

## 2024-03-03 DIAGNOSIS — I6381 Other cerebral infarction due to occlusion or stenosis of small artery: Secondary | ICD-10-CM | POA: Diagnosis not present

## 2024-03-03 DIAGNOSIS — F0390 Unspecified dementia without behavioral disturbance: Secondary | ICD-10-CM | POA: Diagnosis not present

## 2024-04-01 DIAGNOSIS — G4700 Insomnia, unspecified: Secondary | ICD-10-CM | POA: Diagnosis not present

## 2024-04-01 DIAGNOSIS — G4733 Obstructive sleep apnea (adult) (pediatric): Secondary | ICD-10-CM | POA: Diagnosis not present

## 2024-04-01 DIAGNOSIS — I10 Essential (primary) hypertension: Secondary | ICD-10-CM | POA: Diagnosis not present

## 2024-05-09 DIAGNOSIS — G4700 Insomnia, unspecified: Secondary | ICD-10-CM | POA: Diagnosis not present

## 2024-05-09 DIAGNOSIS — G4733 Obstructive sleep apnea (adult) (pediatric): Secondary | ICD-10-CM | POA: Diagnosis not present

## 2024-05-09 DIAGNOSIS — I10 Essential (primary) hypertension: Secondary | ICD-10-CM | POA: Diagnosis not present

## 2024-05-12 DIAGNOSIS — L820 Inflamed seborrheic keratosis: Secondary | ICD-10-CM | POA: Diagnosis not present

## 2024-05-12 DIAGNOSIS — C44622 Squamous cell carcinoma of skin of right upper limb, including shoulder: Secondary | ICD-10-CM | POA: Diagnosis not present

## 2024-05-12 DIAGNOSIS — L570 Actinic keratosis: Secondary | ICD-10-CM | POA: Diagnosis not present

## 2024-05-12 DIAGNOSIS — X32XXXD Exposure to sunlight, subsequent encounter: Secondary | ICD-10-CM | POA: Diagnosis not present

## 2024-05-12 DIAGNOSIS — L821 Other seborrheic keratosis: Secondary | ICD-10-CM | POA: Diagnosis not present

## 2024-05-12 DIAGNOSIS — D225 Melanocytic nevi of trunk: Secondary | ICD-10-CM | POA: Diagnosis not present

## 2024-05-19 ENCOUNTER — Ambulatory Visit

## 2024-05-19 DIAGNOSIS — Z23 Encounter for immunization: Secondary | ICD-10-CM

## 2024-05-19 DIAGNOSIS — Z719 Counseling, unspecified: Secondary | ICD-10-CM

## 2024-05-19 NOTE — Progress Notes (Signed)
 In nurse clinic for Covid vaccine. Last covid vaccine greater than 6 mo ago.   Pfizer Comirnaty 2024-25 (25yrs+) given and tolerated well. Updated [facility] copy given and reviewed. Stayed for 20 min observation without problem. Kimiah Hibner, RN

## 2024-05-24 DIAGNOSIS — G939 Disorder of brain, unspecified: Secondary | ICD-10-CM | POA: Diagnosis not present

## 2024-05-24 DIAGNOSIS — F0390 Unspecified dementia without behavioral disturbance: Secondary | ICD-10-CM | POA: Diagnosis not present

## 2024-05-24 DIAGNOSIS — I6381 Other cerebral infarction due to occlusion or stenosis of small artery: Secondary | ICD-10-CM | POA: Diagnosis not present

## 2024-05-24 DIAGNOSIS — Z1331 Encounter for screening for depression: Secondary | ICD-10-CM | POA: Diagnosis not present

## 2024-06-06 DIAGNOSIS — F0280 Dementia in other diseases classified elsewhere without behavioral disturbance: Secondary | ICD-10-CM | POA: Diagnosis not present

## 2024-06-06 DIAGNOSIS — R41841 Cognitive communication deficit: Secondary | ICD-10-CM | POA: Diagnosis not present

## 2024-06-06 DIAGNOSIS — R471 Dysarthria and anarthria: Secondary | ICD-10-CM | POA: Diagnosis not present

## 2024-06-13 DIAGNOSIS — R41841 Cognitive communication deficit: Secondary | ICD-10-CM | POA: Diagnosis not present

## 2024-06-13 DIAGNOSIS — R471 Dysarthria and anarthria: Secondary | ICD-10-CM | POA: Diagnosis not present

## 2024-06-13 DIAGNOSIS — F0280 Dementia in other diseases classified elsewhere without behavioral disturbance: Secondary | ICD-10-CM | POA: Diagnosis not present

## 2024-06-20 DIAGNOSIS — R41841 Cognitive communication deficit: Secondary | ICD-10-CM | POA: Diagnosis not present

## 2024-06-20 DIAGNOSIS — R471 Dysarthria and anarthria: Secondary | ICD-10-CM | POA: Diagnosis not present

## 2024-06-20 DIAGNOSIS — F0280 Dementia in other diseases classified elsewhere without behavioral disturbance: Secondary | ICD-10-CM | POA: Diagnosis not present

## 2024-06-29 DIAGNOSIS — R471 Dysarthria and anarthria: Secondary | ICD-10-CM | POA: Diagnosis not present

## 2024-06-29 DIAGNOSIS — F0280 Dementia in other diseases classified elsewhere without behavioral disturbance: Secondary | ICD-10-CM | POA: Diagnosis not present

## 2024-06-29 DIAGNOSIS — R41841 Cognitive communication deficit: Secondary | ICD-10-CM | POA: Diagnosis not present

## 2024-07-06 DIAGNOSIS — R41841 Cognitive communication deficit: Secondary | ICD-10-CM | POA: Diagnosis not present

## 2024-07-06 DIAGNOSIS — F0280 Dementia in other diseases classified elsewhere without behavioral disturbance: Secondary | ICD-10-CM | POA: Diagnosis not present

## 2024-07-06 DIAGNOSIS — R471 Dysarthria and anarthria: Secondary | ICD-10-CM | POA: Diagnosis not present

## 2024-07-13 DIAGNOSIS — R471 Dysarthria and anarthria: Secondary | ICD-10-CM | POA: Diagnosis not present

## 2024-07-13 DIAGNOSIS — R41841 Cognitive communication deficit: Secondary | ICD-10-CM | POA: Diagnosis not present

## 2024-07-13 DIAGNOSIS — F0280 Dementia in other diseases classified elsewhere without behavioral disturbance: Secondary | ICD-10-CM | POA: Diagnosis not present

## 2024-07-14 DIAGNOSIS — X32XXXD Exposure to sunlight, subsequent encounter: Secondary | ICD-10-CM | POA: Diagnosis not present

## 2024-07-14 DIAGNOSIS — L570 Actinic keratosis: Secondary | ICD-10-CM | POA: Diagnosis not present

## 2024-07-14 DIAGNOSIS — Z85828 Personal history of other malignant neoplasm of skin: Secondary | ICD-10-CM | POA: Diagnosis not present

## 2024-07-14 DIAGNOSIS — Z08 Encounter for follow-up examination after completed treatment for malignant neoplasm: Secondary | ICD-10-CM | POA: Diagnosis not present

## 2024-07-18 DIAGNOSIS — R471 Dysarthria and anarthria: Secondary | ICD-10-CM | POA: Diagnosis not present

## 2024-07-18 DIAGNOSIS — R41841 Cognitive communication deficit: Secondary | ICD-10-CM | POA: Diagnosis not present

## 2024-07-18 DIAGNOSIS — F0280 Dementia in other diseases classified elsewhere without behavioral disturbance: Secondary | ICD-10-CM | POA: Diagnosis not present

## 2024-07-25 DIAGNOSIS — R471 Dysarthria and anarthria: Secondary | ICD-10-CM | POA: Diagnosis not present

## 2024-07-25 DIAGNOSIS — R41841 Cognitive communication deficit: Secondary | ICD-10-CM | POA: Diagnosis not present

## 2024-07-25 DIAGNOSIS — F0280 Dementia in other diseases classified elsewhere without behavioral disturbance: Secondary | ICD-10-CM | POA: Diagnosis not present

## 2024-07-27 DIAGNOSIS — E785 Hyperlipidemia, unspecified: Secondary | ICD-10-CM | POA: Diagnosis not present

## 2024-07-27 DIAGNOSIS — E1159 Type 2 diabetes mellitus with other circulatory complications: Secondary | ICD-10-CM | POA: Diagnosis not present

## 2024-07-27 DIAGNOSIS — I10 Essential (primary) hypertension: Secondary | ICD-10-CM | POA: Diagnosis not present

## 2024-08-03 DIAGNOSIS — I6930 Unspecified sequelae of cerebral infarction: Secondary | ICD-10-CM | POA: Diagnosis not present

## 2024-08-03 DIAGNOSIS — Z0000 Encounter for general adult medical examination without abnormal findings: Secondary | ICD-10-CM | POA: Diagnosis not present

## 2024-08-03 DIAGNOSIS — Z1331 Encounter for screening for depression: Secondary | ICD-10-CM | POA: Diagnosis not present

## 2024-08-03 DIAGNOSIS — E785 Hyperlipidemia, unspecified: Secondary | ICD-10-CM | POA: Diagnosis not present

## 2024-08-03 DIAGNOSIS — C61 Malignant neoplasm of prostate: Secondary | ICD-10-CM | POA: Diagnosis not present

## 2024-08-03 DIAGNOSIS — K219 Gastro-esophageal reflux disease without esophagitis: Secondary | ICD-10-CM | POA: Diagnosis not present

## 2024-08-03 DIAGNOSIS — E119 Type 2 diabetes mellitus without complications: Secondary | ICD-10-CM | POA: Diagnosis not present

## 2024-08-03 DIAGNOSIS — I10 Essential (primary) hypertension: Secondary | ICD-10-CM | POA: Diagnosis not present

## 2024-08-08 DIAGNOSIS — G4733 Obstructive sleep apnea (adult) (pediatric): Secondary | ICD-10-CM | POA: Diagnosis not present

## 2024-08-08 DIAGNOSIS — R41841 Cognitive communication deficit: Secondary | ICD-10-CM | POA: Diagnosis not present

## 2024-08-08 DIAGNOSIS — G4700 Insomnia, unspecified: Secondary | ICD-10-CM | POA: Diagnosis not present

## 2024-08-08 DIAGNOSIS — I10 Essential (primary) hypertension: Secondary | ICD-10-CM | POA: Diagnosis not present

## 2024-08-08 DIAGNOSIS — R471 Dysarthria and anarthria: Secondary | ICD-10-CM | POA: Diagnosis not present

## 2024-08-08 DIAGNOSIS — F0280 Dementia in other diseases classified elsewhere without behavioral disturbance: Secondary | ICD-10-CM | POA: Diagnosis not present

## 2024-08-15 DIAGNOSIS — R471 Dysarthria and anarthria: Secondary | ICD-10-CM | POA: Diagnosis not present

## 2024-08-15 DIAGNOSIS — F0280 Dementia in other diseases classified elsewhere without behavioral disturbance: Secondary | ICD-10-CM | POA: Diagnosis not present

## 2024-08-15 DIAGNOSIS — R41841 Cognitive communication deficit: Secondary | ICD-10-CM | POA: Diagnosis not present

## 2024-09-09 ENCOUNTER — Ambulatory Visit

## 2024-09-09 DIAGNOSIS — Z719 Counseling, unspecified: Secondary | ICD-10-CM

## 2024-09-09 DIAGNOSIS — Z23 Encounter for immunization: Secondary | ICD-10-CM

## 2024-09-09 NOTE — Progress Notes (Signed)
 In nurse clinic for high dose flu and Pfizer Comirnaty 2025-26 (41yrs+). Counseled on vaccines. Tolerated vaccines well. Updated [facility] copy given and reviewed. Stayed for approx 15 to 20 min for observation without problem. Lumi Winslett, RN

## 2024-09-20 DIAGNOSIS — E119 Type 2 diabetes mellitus without complications: Secondary | ICD-10-CM | POA: Diagnosis not present

## 2024-09-20 DIAGNOSIS — H10233 Serous conjunctivitis, except viral, bilateral: Secondary | ICD-10-CM | POA: Diagnosis not present

## 2024-09-20 DIAGNOSIS — H524 Presbyopia: Secondary | ICD-10-CM | POA: Diagnosis not present

## 2024-09-20 DIAGNOSIS — Z7984 Long term (current) use of oral hypoglycemic drugs: Secondary | ICD-10-CM | POA: Diagnosis not present

## 2024-09-20 DIAGNOSIS — Z961 Presence of intraocular lens: Secondary | ICD-10-CM | POA: Diagnosis not present

## 2024-09-20 DIAGNOSIS — H5213 Myopia, bilateral: Secondary | ICD-10-CM | POA: Diagnosis not present

## 2024-09-20 DIAGNOSIS — H52223 Regular astigmatism, bilateral: Secondary | ICD-10-CM | POA: Diagnosis not present

## 2024-10-10 DIAGNOSIS — X32XXXD Exposure to sunlight, subsequent encounter: Secondary | ICD-10-CM | POA: Diagnosis not present

## 2024-10-10 DIAGNOSIS — L820 Inflamed seborrheic keratosis: Secondary | ICD-10-CM | POA: Diagnosis not present

## 2024-10-10 DIAGNOSIS — L570 Actinic keratosis: Secondary | ICD-10-CM | POA: Diagnosis not present
